# Patient Record
Sex: Female | Born: 1955 | Race: White | Hispanic: No | Marital: Married | Smoking: Never smoker
Health system: Southern US, Community
[De-identification: ages and names within clinical notes are randomized; demographics above are authoritative.]

## PROBLEM LIST (undated history)

## (undated) DIAGNOSIS — T7840XA Allergy, unspecified, initial encounter: Secondary | ICD-10-CM

## (undated) DIAGNOSIS — M810 Age-related osteoporosis without current pathological fracture: Secondary | ICD-10-CM

## (undated) DIAGNOSIS — Z8719 Personal history of other diseases of the digestive system: Secondary | ICD-10-CM

## (undated) DIAGNOSIS — M1990 Unspecified osteoarthritis, unspecified site: Secondary | ICD-10-CM

## (undated) DIAGNOSIS — Z9889 Other specified postprocedural states: Secondary | ICD-10-CM

## (undated) DIAGNOSIS — K219 Gastro-esophageal reflux disease without esophagitis: Secondary | ICD-10-CM

## (undated) DIAGNOSIS — R112 Nausea with vomiting, unspecified: Secondary | ICD-10-CM

## (undated) DIAGNOSIS — B029 Zoster without complications: Secondary | ICD-10-CM

## (undated) DIAGNOSIS — E785 Hyperlipidemia, unspecified: Secondary | ICD-10-CM

## (undated) DIAGNOSIS — R011 Cardiac murmur, unspecified: Secondary | ICD-10-CM

## (undated) DIAGNOSIS — R102 Pelvic and perineal pain: Secondary | ICD-10-CM

## (undated) DIAGNOSIS — I10 Essential (primary) hypertension: Secondary | ICD-10-CM

## (undated) DIAGNOSIS — C801 Malignant (primary) neoplasm, unspecified: Secondary | ICD-10-CM

## (undated) HISTORY — DX: Age-related osteoporosis without current pathological fracture: M81.0

## (undated) HISTORY — DX: Allergy, unspecified, initial encounter: T78.40XA

## (undated) HISTORY — DX: Essential (primary) hypertension: I10

## (undated) HISTORY — PX: FOOT SURGERY: SHX648

## (undated) HISTORY — DX: Zoster without complications: B02.9

## (undated) HISTORY — PX: COLONOSCOPY: SHX174

## (undated) HISTORY — PX: POLYPECTOMY: SHX149

## (undated) HISTORY — PX: WISDOM TOOTH EXTRACTION: SHX21

## (undated) HISTORY — PX: TONSILLECTOMY: SUR1361

## (undated) HISTORY — DX: Hyperlipidemia, unspecified: E78.5

---
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
## 1986-03-13 HISTORY — PX: VAGINAL HYSTERECTOMY: SUR661

## 2001-09-03 ENCOUNTER — Emergency Department (HOSPITAL_COMMUNITY): Admission: EM | Admit: 2001-09-03 | Discharge: 2001-09-03 | Payer: Self-pay | Admitting: Emergency Medicine

## 2005-03-16 ENCOUNTER — Emergency Department (HOSPITAL_COMMUNITY): Admission: EM | Admit: 2005-03-16 | Discharge: 2005-03-16 | Payer: Self-pay | Admitting: Emergency Medicine

## 2006-03-13 HISTORY — PX: ESOPHAGOGASTRODUODENOSCOPY: SHX1529

## 2006-03-15 ENCOUNTER — Encounter (INDEPENDENT_AMBULATORY_CARE_PROVIDER_SITE_OTHER): Payer: Self-pay | Admitting: *Deleted

## 2006-03-15 ENCOUNTER — Encounter: Admission: RE | Admit: 2006-03-15 | Discharge: 2006-03-15 | Payer: Self-pay | Admitting: Internal Medicine

## 2006-10-09 ENCOUNTER — Ambulatory Visit (HOSPITAL_COMMUNITY): Admission: RE | Admit: 2006-10-09 | Discharge: 2006-10-09 | Payer: Self-pay | Admitting: Obstetrics and Gynecology

## 2006-12-24 ENCOUNTER — Ambulatory Visit: Payer: Self-pay | Admitting: Internal Medicine

## 2007-01-21 DIAGNOSIS — K5730 Diverticulosis of large intestine without perforation or abscess without bleeding: Secondary | ICD-10-CM | POA: Insufficient documentation

## 2007-01-31 ENCOUNTER — Encounter: Payer: Self-pay | Admitting: Internal Medicine

## 2007-01-31 ENCOUNTER — Ambulatory Visit: Payer: Self-pay | Admitting: Internal Medicine

## 2007-01-31 DIAGNOSIS — D126 Benign neoplasm of colon, unspecified: Secondary | ICD-10-CM | POA: Insufficient documentation

## 2007-04-15 ENCOUNTER — Ambulatory Visit (HOSPITAL_COMMUNITY): Admission: RE | Admit: 2007-04-15 | Discharge: 2007-04-15 | Payer: Self-pay | Admitting: Obstetrics and Gynecology

## 2007-05-24 DIAGNOSIS — K2940 Chronic atrophic gastritis without bleeding: Secondary | ICD-10-CM | POA: Insufficient documentation

## 2007-05-24 DIAGNOSIS — K2100 Gastro-esophageal reflux disease with esophagitis, without bleeding: Secondary | ICD-10-CM | POA: Insufficient documentation

## 2007-05-24 DIAGNOSIS — K219 Gastro-esophageal reflux disease without esophagitis: Secondary | ICD-10-CM | POA: Insufficient documentation

## 2007-05-24 DIAGNOSIS — R1310 Dysphagia, unspecified: Secondary | ICD-10-CM | POA: Insufficient documentation

## 2007-12-16 ENCOUNTER — Encounter: Admission: RE | Admit: 2007-12-16 | Discharge: 2007-12-16 | Payer: Self-pay | Admitting: Internal Medicine

## 2009-03-30 DIAGNOSIS — K2990 Gastroduodenitis, unspecified, without bleeding: Secondary | ICD-10-CM | POA: Insufficient documentation

## 2009-03-30 DIAGNOSIS — M1990 Unspecified osteoarthritis, unspecified site: Secondary | ICD-10-CM | POA: Insufficient documentation

## 2009-03-30 DIAGNOSIS — J302 Other seasonal allergic rhinitis: Secondary | ICD-10-CM | POA: Insufficient documentation

## 2009-06-29 ENCOUNTER — Encounter: Payer: Self-pay | Admitting: Internal Medicine

## 2009-06-30 ENCOUNTER — Telehealth: Payer: Self-pay | Admitting: Internal Medicine

## 2009-08-03 ENCOUNTER — Ambulatory Visit: Payer: Self-pay | Admitting: Internal Medicine

## 2009-08-03 DIAGNOSIS — R1319 Other dysphagia: Secondary | ICD-10-CM | POA: Insufficient documentation

## 2009-08-03 DIAGNOSIS — Z860100 Personal history of colon polyps, unspecified: Secondary | ICD-10-CM | POA: Insufficient documentation

## 2010-02-10 ENCOUNTER — Ambulatory Visit (HOSPITAL_COMMUNITY): Admission: RE | Admit: 2010-02-10 | Discharge: 2010-02-10 | Payer: Self-pay | Admitting: Obstetrics and Gynecology

## 2010-04-12 NOTE — Assessment & Plan Note (Signed)
Summary: GERD followup, dysphagia    History of Present Illness Visit Type: Follow-up Visit Primary GI MD: Yancey Flemings MD Primary Provider: Rodrigo Ran, MD Chief Complaint: Dysphagia, mostly in the evening; patient also needs refill of Aciphex History of Present Illness:   55 year old white female with GERD, osteoporosis, and colon polyp. She was evaluated in October of 2008 for dysphagia and screening colonoscopy. She underwent upper endoscopy and was found to have active esophagitis. She was placed on PPI therapy and instructed to followup in 6-8 weeks. She did not. Screening colonoscopy at that same time revealed mild diverticulosis and a diminutive ascending colon polyp which was removed but not retrieved for submission (looked like an adenoma). Followup colonoscopy in 5 years recommend. She presents today for followup after contacting the office requesting medication refill. She notices that she will have significant dysphagia off PPI therapy. Rare dysphagia on PPI therapy. She is taking AcipHex 20 mg daily. She wonders about a more cost effective alternative. No appreciable medication side effects. I did discuss with her information regarding chronic PPI use and possible effect on bone density. She has her bone density monitored carefully and is on calcium and vitamin D. No other active GI complaints at this time. Her last episode of dysphagia was about 2 weeks ago when eating rice. She has not had a food impaction or near impaction. No lower GI complaints.   GI Review of Systems    Reports acid reflux and  dysphagia with solids.      Denies abdominal pain, belching, bloating, chest pain, dysphagia with liquids, heartburn, loss of appetite, nausea, vomiting, vomiting blood, weight loss, and  weight gain.      Reports diverticulosis.     Denies anal fissure, black tarry stools, change in bowel habit, constipation, diarrhea, fecal incontinence, heme positive stool, hemorrhoids, irritable bowel  syndrome, jaundice, light color stool, liver problems, rectal bleeding, and  rectal pain. Preventive Screening-Counseling & Management  Alcohol-Tobacco     Smoking Status: never  Caffeine-Diet-Exercise     Does Patient Exercise: yes      Drug Use:  no.      Current Medications (verified): 1)  Aciphex 20 Mg Tbec (Rabeprazole Sodium) .Marland Kitchen.. 1 Tablet By Mouth Once Daily 2)  Estradiol 0.5 Mg Tabs (Estradiol) .... Take 2 Tablets By Mouth Daily 3)  Calcium 1500 Mg Tabs (Calcium Carbonate) .... Once Daily 4)  Vitamin D 1500iu .... Once Daily  Allergies (verified): 1)  ! Codeine  Past History:  Past Medical History: Reviewed history from 05/24/2007 and no changes required. Current Problems:  DYSPHAGIA UNSPECIFIED (ICD-787.20) GASTROESOPHAGEAL REFLUX DISEASE (ICD-530.81) GASTRITIS, CHRONIC (ICD-535.10) ESOPHAGITIS, REFLUX (ICD-530.11) POLYP, COLON (ICD-211.3) DIVERTICULOSIS, COLON (ICD-562.10)  Past Surgical History: Reviewed history from 05/24/2007 and no changes required. Total Abdominal Hysterectomy  Family History: Reviewed history and no changes required. No FH of Colon Cancer: Brain Cancer: Father Family History of Colon Polyps:Mother Family History of Diabetes: GF RA & AS: Son Diverticulitis-mother  Social History: Reviewed history and no changes required. Occupation: Psychologist, sport and exercise Patient has never smoked.  Alcohol Use - yes Daily Caffeine Use Illicit Drug Use - no Patient gets regular exercise. Smoking Status:  never Drug Use:  no Does Patient Exercise:  yes  Review of Systems       The patient complains of allergy/sinus, anemia, arthritis/joint pain, back pain, and muscle pains/cramps.  The patient denies anxiety-new, blood in urine, breast changes/lumps, change in vision, confusion, cough, coughing up blood, depression-new, fainting, fatigue, fever,  headaches-new, hearing problems, heart murmur, heart rhythm changes, itching, menstrual pain, night sweats,  nosebleeds, pregnancy symptoms, shortness of breath, skin rash, sleeping problems, sore throat, swelling of feet/legs, swollen lymph glands, thirst - excessive , urination - excessive , urination changes/pain, urine leakage, vision changes, and voice change.    Vital Signs:  Patient profile:   55 year old female Height:      63 inches Weight:      139.25 pounds BMI:     24.76 Pulse rate:   72 / minute Pulse rhythm:   regular BP sitting:   110 / 78  (left arm) Cuff size:   regular  Vitals Entered By: June McMurray CMA Duncan Dull) (Aug 03, 2009 11:06 AM)  Physical Exam  General:  Well developed, well nourished, no acute distress. Head:  Normocephalic and atraumatic. Eyes:  PERRLA, no icterus. Nose:  No deformity, discharge,  or lesions. Mouth:  No deformity or lesions, dentition normal. Neck:  Supple; no masses or thyromegaly. Lungs:  Clear throughout to auscultation. Heart:  Regular rate and rhythm; no murmurs, rubs,  or bruits. Abdomen:  Soft, nontender and nondistended. No masses, hepatosplenomegaly or hernias noted. Normal bowel sounds. Pulses:  Normal pulses noted. Extremities:  no edema Neurologic:  Alert and  oriented x4;   Skin:  Intact without significant lesions or rashes. Psych:  Alert and cooperative. Normal mood and affect.   Impression & Recommendations:  Problem # 1:  GASTROESOPHAGEAL REFLUX DISEASE (ICD-530.81) GERD with endoscopic evidence of esophagitis on prior upper endoscopy.  Plan: #1. Reflux precautions #2. Continue PPI therapy to address esophagitis and associated dysphagia #3. Prescribed generic omeprazole milligrams daily. Multiple refills #4. Routine GI followup in 2 years. Sooner for problems or questions  Problem # 2:  PERSONAL HX COLONIC POLYPS (ICD-V12.72) history of diminutive colon polyp, likely adenoma. Surveillance colonoscopy plan around November 2013. Keep that anniversary  Problem # 3:  DYSPHAGIA (ICD-787.29) the do to esophagitis,  possible underlying stricture. Minimal symptoms as long as she takes PPI therapy regularly. We discussed the prospect of esophageal dilation should her dysphagia worsen despite PPI therapy. She is familiar with this, as her mother has needed the same  Patient Instructions: 1)  Change Aciphex to Omeprazole 20 mg by mouth once daily 2)  #30 x 11 RFs. sent to pharmacy. 3)  Office Visit in 2 years. 4)  The medication list was reviewed and reconciled.  All changed / newly prescribed medications were explained.  A complete medication list was provided to the patient / caregiver. 5)  prited and given to patient. Milford Cage NCMA  Aug 03, 2009 11:36 AM 6)  copy: Dr. Rodrigo Ran Prescriptions: OMEPRAZOLE 20 MG CPDR (OMEPRAZOLE) 1 by mouth once daily  #30 x 11   Entered by:   Milford Cage NCMA   Authorized by:   Hilarie Fredrickson MD   Signed by:   Milford Cage NCMA on 08/03/2009   Method used:   Electronically to        [facility]* (retail)       [address]       [place], [place]  [id number]       Ph: [phone]       Fax: [phone]   RxID:   [id number]

## 2010-04-12 NOTE — Medication Information (Signed)
Summary: [facility]   Imported By: Lester Kirtland 07/05/2009 10:12:36  _____________________________________________________________________  External Attachment:    Type:   Image     Comment:   External Document

## 2010-04-12 NOTE — Progress Notes (Signed)
Summary: Aciphex refill/appt. made   Phone Note Outgoing Call   Call placed by: Milford Cage NCMA,  June 30, 2009 11:18 AM Call placed to: Patient Summary of Call: Called patient and advised she needs to make an appt. with Dr. Marina Goodell since it has been since 12/2006 since last seen.  Appt. made for 08/03/09 and #30 of Aciphex given until seen. Milford Cage NCMA  June 30, 2009 11:19 AM  Initial call taken by: Milford Cage NCMA,  June 30, 2009 11:19 AM

## 2010-07-26 NOTE — Assessment & Plan Note (Signed)
Jody Lucas                         GASTROENTEROLOGY OFFICE NOTE   Jody Lucas, Jody Lucas                    MRN:          [id number]  DATE:12/24/2006                            DOB:          20-Nov-1955    REFERRING PHYSICIAN:  Loraine Leriche A. Perini, M.D.   REASON FOR CONSULTATION:  Dysphagia and screening colonoscopy.   HISTORY:  This is a pleasant 55 year old white female with no  significant past medical history who is referred through the courtesy of  Dr. Waynard Edwards regarding dysphagia and screening colonoscopy.  The patient  reports a 52-month history of intermittent solid food dysphagia occurring  approximately 2 to 3 times per month.  The frequency and severity has  not worsened.  There has been no associated heartburn or weight loss.  Next, she reports not having had screening colonoscopy.  She reports  that her mother has a history of colon polyps.  She denies abdominal  pain, change in bowel habits, or rectal bleeding.  No weight loss.   PAST MEDICAL HISTORY:  None.   PAST SURGICAL HISTORY:  Hysterectomy (ovaries are intact).   ALLERGIES:  CODEINE.   CURRENT MEDICATIONS:  Calcium.  Multivitamin.  Iron supplement.   FAMILY HISTORY:  Mother with colon polyps and ulcerative colitis.   SOCIAL HISTORY:  The patient is married with 3 children.  She lives with  her husband.  She attends college.  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  She does not smoke or use alcohol.   REVIEW OF SYSTEMS:  Per diagnostic evaluation form.   PHYSICAL EXAM:  Well-appearing female in no acute distress.  Blood pressure is 120/76, heart rate is 68, weight 140 pounds.  She is 5  feet 3 inches in height.  HEENT:  Sclerae anicteric.  Conjunctivae pink.  Oral mucosa is intact.  LUNGS:  Clear.  HEART:  Regular.  ABDOMEN:  Soft without tenderness, mass, or hernia.  EXTREMITIES:  Without edema.   IMPRESSION:  1. Intermittent solid food dysphagia  without other worrisome features.      Rule out ring, stricture, or web.  2. Screening colonoscopy.  Mother with a history of colon polyps.  She      is an appropriate candidate for screening without contraindication.   RECOMMENDATIONS:  Upper endoscopy with possible esophageal dilation and  colonoscopy with polypectomy if needed.  The nature of the procedures as  well as risks, benefits, and alternatives were discussed in detail.  She  understood and agreed to proceed.  As well, literature provided on  colonoscopy, colon cancer screening, upper endoscopy, esophageal  dilation, and esophageal stricture.     Wilhemina Bonito. Marina Goodell, MD  Electronically Signed    JNP/MedQ  DD: 12/24/2006  DT: 12/25/2006  Job #: [id number]   cc:   Loraine Leriche A. Waynard Edwards, M.D.  Katherine Roan, M.D.

## 2011-03-21 DIAGNOSIS — H43819 Vitreous degeneration, unspecified eye: Secondary | ICD-10-CM | POA: Insufficient documentation

## 2011-03-21 DIAGNOSIS — H04123 Dry eye syndrome of bilateral lacrimal glands: Secondary | ICD-10-CM | POA: Insufficient documentation

## 2011-04-11 ENCOUNTER — Other Ambulatory Visit: Payer: Self-pay

## 2011-04-11 MED ORDER — OMEPRAZOLE 20 MG PO CPDR: 20.0000 mg | DELAYED_RELEASE_CAPSULE | Freq: Two times a day (BID) | ORAL | Status: DC

## 2011-04-11 NOTE — Telephone Encounter (Signed)
rx refill for omeprazole; filled rx for patient

## 2011-04-13 ENCOUNTER — Other Ambulatory Visit: Payer: Self-pay | Admitting: Internal Medicine

## 2011-07-25 DIAGNOSIS — I10 Essential (primary) hypertension: Secondary | ICD-10-CM | POA: Insufficient documentation

## 2012-01-19 ENCOUNTER — Encounter: Payer: Self-pay | Admitting: Internal Medicine

## 2012-03-29 ENCOUNTER — Encounter: Payer: Self-pay | Admitting: Internal Medicine

## 2012-04-24 ENCOUNTER — Other Ambulatory Visit: Payer: Self-pay | Admitting: Dermatology

## 2012-04-25 ENCOUNTER — Ambulatory Visit: Payer: Self-pay | Admitting: Gynecology

## 2012-05-01 ENCOUNTER — Ambulatory Visit (AMBULATORY_SURGERY_CENTER): Payer: 59 | Admitting: *Deleted

## 2012-05-01 ENCOUNTER — Encounter: Payer: Self-pay | Admitting: Internal Medicine

## 2012-05-01 VITALS — Ht 63.0 in | Wt 147.6 lb

## 2012-05-01 DIAGNOSIS — Z86010 Personal history of colonic polyps: Secondary | ICD-10-CM

## 2012-05-01 DIAGNOSIS — Z1211 Encounter for screening for malignant neoplasm of colon: Secondary | ICD-10-CM

## 2012-05-01 MED ORDER — MOVIPREP 100 G PO SOLR: 1.0000 | Freq: Once | ORAL | Status: DC

## 2012-05-01 NOTE — Progress Notes (Signed)
No egg or soy allergy. ewm No problems with sedation. ewm

## 2012-05-06 ENCOUNTER — Other Ambulatory Visit (HOSPITAL_COMMUNITY)
Admission: RE | Admit: 2012-05-06 | Discharge: 2012-05-06 | Disposition: A | Discharge status: Home / Self Care | Payer: 59 | Source: Ambulatory Visit | Attending: Gynecology | Admitting: Gynecology

## 2012-05-06 ENCOUNTER — Encounter: Payer: Self-pay | Admitting: Gynecology

## 2012-05-06 ENCOUNTER — Ambulatory Visit (INDEPENDENT_AMBULATORY_CARE_PROVIDER_SITE_OTHER): Payer: 59 | Admitting: Gynecology

## 2012-05-06 VITALS — BP 120/78 | Ht 62.5 in | Wt 142.0 lb

## 2012-05-06 DIAGNOSIS — Z01419 Encounter for gynecological examination (general) (routine) without abnormal findings: Secondary | ICD-10-CM

## 2012-05-06 DIAGNOSIS — N949 Unspecified condition associated with female genital organs and menstrual cycle: Secondary | ICD-10-CM

## 2012-05-06 DIAGNOSIS — R102 Pelvic and perineal pain: Secondary | ICD-10-CM

## 2012-05-06 MED ORDER — ESTRADIOL 1 MG PO TABS: 0.5000 mg | ORAL_TABLET | Freq: Every day | ORAL | Status: DC

## 2012-05-06 NOTE — Progress Notes (Signed)
Jody Lucas 01/10/1956 [id number]        57 y.o.  G3P3003 new patient for annual exam.  Former patient of Dr. Angeline Slim. Several issues noted below.  Past medical history,surgical history, medications, allergies, family history and social history were all reviewed and documented in the EPIC chart. ROS:  Was performed and pertinent positives and negatives are included in the history.  Exam: [name] assistant Filed Vitals:   05/06/12 1516  BP: 120/78  Height: 5' 2.5" (1.588 m)  Weight: 142 lb (64.411 kg)   General appearance  Normal Skin grossly normal Head/Neck normal with no cervical or supraclavicular adenopathy thyroid normal Lungs  clear Cardiac RR, without RMG Abdominal  soft, nontender, without masses, organomegaly or hernia Breasts  examined lying and sitting without masses, retractions, discharge or axillary adenopathy. Pelvic  Ext/BUS/vagina  normal    Adnexa  Without masses or tenderness    Anus and perineum  normal   Rectovaginal  normal sphincter tone without palpated masses or tenderness.    Assessment/Plan:  57 y.o. G47P3003 female for annual exam.   1. History of TVH for bleeding and dysplasia. 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 Pap smears have been normal since then. Last Pap smear 2011. Pap done today of cough. Discussed current screening guidelines and options to stop screening altogether versus less frequent screening interval reviewed and we'll readdress on an annual basis. 2. Vaginal pain. Patient notes some fleeting vaginal-type pain aching to stabbing lasts minutes. Sometimes wakes her from the sleep. No significant constipation diarrhea or urinary symptoms. No dyspareunia. Exam is normal. We'll start with ultrasound. She does have colonoscopy scheduled this week. 3. ERT.  Patients on Estrace 1 mg started a number of years ago due to hot flashes and night sweats. Recommended decreasing to 0.5 mg and see how she does this year. Ultimately planning on  weaning her off next year.  Reviewed the WHI study and risks of ERT to include stroke heart attack DVT possible breast cancer issues. Patient's comfortable continuing but does agree with the ultimate plan of weaning her next year. 4. Osteoporosis. Recent DEXA 05/2011. Actively being followed by Dr. Waynard Edwards. On no current medications. She'll continue to follow up with him for management of her osteoporosis. 5. Mammogram 03/2012. Continued annual mammography. SBE monthly reviewed. 6. Colonoscopy scheduled this week. Will follow up for this. 7. Health maintenance.  No blood work done as it is all done through Dr. Laurey Morale office. Follow up for ultrasound, otherwise annually.    Dara Lords MD, 4:39 PM 05/06/2012

## 2012-05-06 NOTE — Patient Instructions (Addendum)
Follow up for ultrasound as scheduled 

## 2012-05-07 LAB — URINALYSIS W MICROSCOPIC + REFLEX CULTURE
Bacteria, UA: NONE SEEN
Crystals: NONE SEEN
Ketones, ur: NEGATIVE mg/dL
Nitrite: NEGATIVE
Specific Gravity, Urine: 1.006 (ref 1.005–1.030)
Urobilinogen, UA: 0.2 mg/dL (ref 0.0–1.0)

## 2012-05-09 ENCOUNTER — Encounter: Payer: Self-pay | Admitting: Internal Medicine

## 2012-05-09 ENCOUNTER — Ambulatory Visit (AMBULATORY_SURGERY_CENTER): Payer: 59 | Admitting: Internal Medicine

## 2012-05-09 VITALS — BP 141/91 | HR 67 | Temp 96.3°F | Resp 25 | Ht 63.0 in | Wt 147.0 lb

## 2012-05-09 DIAGNOSIS — Z860100 Personal history of colon polyps, unspecified: Secondary | ICD-10-CM

## 2012-05-09 DIAGNOSIS — K5730 Diverticulosis of large intestine without perforation or abscess without bleeding: Secondary | ICD-10-CM

## 2012-05-09 DIAGNOSIS — Z1211 Encounter for screening for malignant neoplasm of colon: Secondary | ICD-10-CM

## 2012-05-09 MED ORDER — SODIUM CHLORIDE 0.9 % IV SOLN: 500.0000 mL | INTRAVENOUS | Status: DC

## 2012-05-09 NOTE — Progress Notes (Signed)
Patient did not experience any of the following events: a burn prior to discharge; a fall within the facility; wrong site/side/patient/procedure/implant event; or a hospital transfer or hospital admission upon discharge from the facility. (G8907) Patient did not have preoperative order for IV antibiotic SSI prophylaxis. (G8918)  

## 2012-05-09 NOTE — Progress Notes (Signed)
Lidocaine-40mg IV prior to Propofol InductionPropofol given over incremental dosages 

## 2012-05-09 NOTE — Op Note (Signed)
[facility] [address] [place], [address]   COLONOSCOPY PROCEDURE REPORT  PATIENT: Jody Lucas, Jody Lucas  MR#: [id number] BIRTHDATE: Mar 24, 1955 , 56  yrs. old GENDER: Female ENDOSCOPIST: Roxy Cedar, MD REFERRED AO:[id number] Program Recall PROCEDURE DATE:  05/09/2012 PROCEDURE:   Colonoscopy, surveillance ASA CLASS:   Class II INDICATIONS:Patient's personal history of colon polyps. November 2008 with diminutive polyp (looked like adenoma). MEDICATIONS: MAC sedation, administered by CRNA and propofol (Diprivan) 250mg  IV  DESCRIPTION OF PROCEDURE:   After the risks benefits and alternatives of the procedure were thoroughly explained, informed consent was obtained.  A digital rectal exam revealed no abnormalities of the rectum.   The LB [id number]-H180AL [id number]  endoscope was introduced through the anus and advanced to the cecum, which was identified by both the appendix and ileocecal valve. No adverse events experienced.   The quality of the prep was good, using MoviPrep  The instrument was then slowly withdrawn as the colon was fully examined.      COLON FINDINGS: Moderate diverticulosis was noted in the sigmoid colon.   The colon mucosa was otherwise normal.  Retroflexed views revealed internal hemorrhoids. The time to cecum=3 minutes 42 seconds.  Withdrawal time=10 minutes 17 seconds.  The scope was withdrawn and the procedure completed. COMPLICATIONS: There were no complications.  ENDOSCOPIC IMPRESSION: 1.   Moderate diverticulosis was noted in the sigmoid colon 2.   The colon mucosa was otherwise normal  RECOMMENDATIONS: 1. Continue current colorectal screening recommendations for "routine risk" patients with a repeat colonoscopy in 10 years.   eSigned:  Roxy Cedar, MD 05/09/2012 11:03 AM   cc: The Patient and Rodrigo Ran, MD

## 2012-05-09 NOTE — Patient Instructions (Addendum)
Discharge instructions given with verbal understanding. Handout on diverticulosis and a high fiber diet given. Resume previous medications. YOU HAD AN ENDOSCOPIC PROCEDURE TODAY AT THE [facility]: Refer to the procedure report that was given to you for any specific questions about what was found during the examination.  If the procedure report does not answer your questions, please call your gastroenterologist to clarify.  If you requested that your care partner not be given the details of your procedure findings, then the procedure report has been included in a sealed envelope for you to review at your convenience later.  YOU SHOULD EXPECT: Some feelings of bloating in the abdomen. Passage of more gas than usual.  Walking can help get rid of the air that was put into your GI tract during the procedure and reduce the bloating. If you had a lower endoscopy (such as a colonoscopy or flexible sigmoidoscopy) you may notice spotting of blood in your stool or on the toilet paper. If you underwent a bowel prep for your procedure, then you may not have a normal bowel movement for a few days.  DIET: Your first meal following the procedure should be a light meal and then it is ok to progress to your normal diet.  A half-sandwich or bowl of soup is an example of a good first meal.  Heavy or fried foods are harder to digest and may make you feel nauseous or bloated.  Likewise meals heavy in dairy and vegetables can cause extra gas to form and this can also increase the bloating.  Drink plenty of fluids but you should avoid alcoholic beverages for 24 hours.  ACTIVITY: Your care partner should take you home directly after the procedure.  You should plan to take it easy, moving slowly for the rest of the day.  You can resume normal activity the day after the procedure however you should NOT DRIVE or use heavy machinery for 24 hours (because of the sedation medicines used during the test).    SYMPTOMS TO  REPORT IMMEDIATELY: A gastroenterologist can be reached at any hour.  During normal business hours, 8:30 AM to 5:00 PM Monday through Friday, call [phone].  After hours and on weekends, please call the GI answering service at [phone] who will take a message and have the physician on call contact you.   Following lower endoscopy (colonoscopy or flexible sigmoidoscopy):  Excessive amounts of blood in the stool  Significant tenderness or worsening of abdominal pains  Swelling of the abdomen that is new, acute  Fever of 100F or higher  FOLLOW UP: If any biopsies were taken you will be contacted by phone or by letter within the next 1-3 weeks.  Call your gastroenterologist if you have not heard about the biopsies in 3 weeks.  Our staff will call the home number listed on your records the next business day following your procedure to check on you and address any questions or concerns that you may have at that time regarding the information given to you following your procedure. This is a courtesy call and so if there is no answer at the home number and we have not heard from you through the emergency physician on call, we will assume that you have returned to your regular daily activities without incident.  SIGNATURES/CONFIDENTIALITY: You and/or your care partner have signed paperwork which will be entered into your electronic medical record.  These signatures attest to the fact that that the information above on   your After Visit Summary has been reviewed and is understood.  Full responsibility of the confidentiality of this discharge information lies with you and/or your care-partner. 

## 2012-05-10 ENCOUNTER — Telehealth: Payer: Self-pay | Admitting: *Deleted

## 2012-05-10 NOTE — Telephone Encounter (Signed)
  Follow up Call-  Call back number 05/09/2012  Post procedure Call Back phone  # [phone]  Permission to leave phone message Yes     Patient questions:  Do you have a fever, pain , or abdominal swelling? no Pain Score  0 *  Have you tolerated food without any problems? yes  Have you been able to return to your normal activities? yes  Do you have any questions about your discharge instructions: Diet   no Medications  no Follow up visit  no  Do you have questions or concerns about your Care? no  Actions: * If pain score is 4 or above: No action needed, pain <4.

## 2012-05-15 ENCOUNTER — Other Ambulatory Visit: Payer: Self-pay | Admitting: Gynecology

## 2012-05-15 ENCOUNTER — Encounter: Payer: Self-pay | Admitting: Gynecology

## 2012-05-15 ENCOUNTER — Ambulatory Visit (INDEPENDENT_AMBULATORY_CARE_PROVIDER_SITE_OTHER): Payer: 59

## 2012-05-15 ENCOUNTER — Ambulatory Visit (INDEPENDENT_AMBULATORY_CARE_PROVIDER_SITE_OTHER): Payer: 59 | Admitting: Gynecology

## 2012-05-15 DIAGNOSIS — R102 Pelvic and perineal pain: Secondary | ICD-10-CM

## 2012-05-15 DIAGNOSIS — N949 Unspecified condition associated with female genital organs and menstrual cycle: Secondary | ICD-10-CM

## 2012-05-15 DIAGNOSIS — N83209 Unspecified ovarian cyst, unspecified side: Secondary | ICD-10-CM

## 2012-05-15 DIAGNOSIS — D3912 Neoplasm of uncertain behavior of left ovary: Secondary | ICD-10-CM

## 2012-05-15 DIAGNOSIS — D3910 Neoplasm of uncertain behavior of unspecified ovary: Secondary | ICD-10-CM

## 2012-05-15 NOTE — Patient Instructions (Signed)
Followup for ultrasound in 6 months to follow the left ovarian cyst.

## 2012-05-15 NOTE — Progress Notes (Signed)
Patient goals are for ultrasound due to episodic vaginal pain. She had a colonoscopy and in fact does have diverticulosis and her gastroenterologist noted this may be contributing to her pain.  Ultrasound status post hysterectomy shows normal-appearing right ovary. 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 Small area of calcification 0.3 mm noted at periphery.  Assessment and plan: Vaginal pain small left ovarian cyst. On review of correct words to include pictures she has a unchanged cyst dating back to 2008/2009 which in review looks identical to what at present today. Possibilities include endometrioma reviewed. Cannot rule out cancer also discussed although highly unlikely given the stability over years time. Options for surgery, reultrasound in 6 months with or without baseline CA 125 reviewed and offered. Possibilities of false positive CA 125  particularly if endometrioma discussed. After lengthy discussion the patient would prefer no intervention, no CA 125 but repeat ultrasound in 6 months and she will followup for this.

## 2012-12-12 ENCOUNTER — Ambulatory Visit (INDEPENDENT_AMBULATORY_CARE_PROVIDER_SITE_OTHER): Payer: 59

## 2012-12-12 ENCOUNTER — Ambulatory Visit (INDEPENDENT_AMBULATORY_CARE_PROVIDER_SITE_OTHER): Payer: 59 | Admitting: Podiatry

## 2012-12-12 VITALS — BP 132/90 | HR 62 | Resp 12 | Ht 63.0 in | Wt 138.0 lb

## 2012-12-12 DIAGNOSIS — M21611 Bunion of right foot: Secondary | ICD-10-CM

## 2012-12-12 DIAGNOSIS — M21619 Bunion of unspecified foot: Secondary | ICD-10-CM

## 2012-12-12 DIAGNOSIS — M2040 Other hammer toe(s) (acquired), unspecified foot: Secondary | ICD-10-CM

## 2012-12-12 DIAGNOSIS — M2010 Hallux valgus (acquired), unspecified foot: Secondary | ICD-10-CM

## 2012-12-12 NOTE — Patient Instructions (Signed)
Call with questions.

## 2012-12-12 NOTE — Progress Notes (Signed)
N  SORE, BURNING SENSATION, SWELLING L   B/L FOOT AND TOES D   20 YEARS O   SLOWLY C    WORSE A     WALKING, SITTING T      SOAKING ([place]) WORSE OVER THE LAST 15 YEARS. GETTING KNOTS ON OTHER TOES. TAILOR BUNION BOTH FEET.. 2ND TOE PLANTAR  CALLUS. The pain has been gradually getting worse over this period of time and making shoe gear increasingly difficult. Patient would like to discuss surgical correction of deformity. Objective findings; pulses PT +2/4 bilateral DP +2/4 bilateral. Neurological was found to be intact sharp dull and vibratory intact.. Muscle strength intact all muscle groups. Structural deformity indicates large hyperostosis medial aspect first metatarsal head bilateral with deformity of the fifth metatarsal bilateral. Keratotic lesion noted second digit left foot with pain.  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 Also noted to have symptomatic tailor's bunion deformity bilateral and keratotic lesion second digit left foot that is symptomatic   Plan; discussed with patient treatment options. She has done all conservative care consisting of wider shoe gear soft leather-type shoes soaks and oral anti-inflammatories. reviewed x-rays and recommended surgical intervention with a 1 foot been corrected at the time. Patient will have aggressive Austin bunionectomy metatarsal osteotomy fifth left foot and hammertoe repair second left. I explained we will not be able to get complete correction of deformity but it should give her inferior good long-term result. Recently had a one week for consult surgery scheduled for 2 weeks.

## 2012-12-19 ENCOUNTER — Encounter: Payer: Self-pay | Admitting: Podiatry

## 2012-12-19 ENCOUNTER — Ambulatory Visit (INDEPENDENT_AMBULATORY_CARE_PROVIDER_SITE_OTHER): Payer: 59 | Admitting: Podiatry

## 2012-12-19 VITALS — BP 158/98 | HR 63 | Resp 16

## 2012-12-19 DIAGNOSIS — M2010 Hallux valgus (acquired), unspecified foot: Secondary | ICD-10-CM

## 2012-12-19 DIAGNOSIS — M2040 Other hammer toe(s) (acquired), unspecified foot: Secondary | ICD-10-CM

## 2012-12-19 MED ORDER — PROMETHAZINE HCL 25 MG PO TABS: 25.0000 mg | ORAL_TABLET | ORAL | Status: DC | PRN

## 2012-12-19 MED ORDER — OXYCODONE-ACETAMINOPHEN 10-325 MG PO TABS: 1.0000 | ORAL_TABLET | Freq: Three times a day (TID) | ORAL | Status: DC | PRN

## 2012-12-19 MED ORDER — MEPERIDINE HCL 50 MG PO TABS: 50.0000 mg | ORAL_TABLET | ORAL | Status: DC | PRN

## 2012-12-19 NOTE — Patient Instructions (Signed)
Pre-Operative Instructions  Congratulations, you have decided to take an important step to improving your quality of life.  You can be assured that the doctors of [facility] will be with you every step of the way.  1. Plan to be at the surgery center/hospital at least 1 (one) hour prior to your scheduled time unless otherwise directed by the surgical center/hospital staff.  You must have a responsible adult accompany you, remain during the surgery and drive you home.  Make sure you have directions to the surgical center/hospital and know how to get there on time. 2. For hospital based surgery you will need to obtain a history and physical form from your family physician within 1 month prior to the date of surgery- we will give you a form for you primary physician.  3. We make every effort to accommodate the date you request for surgery.  There are however, times where surgery dates or times have to be moved.  We will contact you as soon as possible if a change in schedule is required.   4. No Aspirin/Ibuprofen for one week before surgery.  If you are on aspirin, any non-steroidal anti-inflammatory medications (Mobic, Aleve, Ibuprofen) you should stop taking it 7 days prior to your surgery.  You make take Tylenol  For pain prior to surgery.  5. Medications- If you are taking daily heart and blood pressure medications, seizure, reflux, allergy, asthma, anxiety, pain or diabetes medications, make sure the surgery center/hospital is aware before the day of surgery so they may notify you which medications to take or avoid the day of surgery. 6. No food or drink after midnight the night before surgery unless directed otherwise by surgical center/hospital staff. 7. No alcoholic beverages 24 hours prior to surgery.  No smoking 24 hours prior to or 24 hours after surgery. 8. Wear loose pants or shorts- loose enough to fit over bandages, boots, and casts. 9. No slip on shoes, sneakers are best. 10. Bring  your boot with you to the surgery center/hospital.  Also bring crutches or a walker if your physician has prescribed it for you.  If you do not have this equipment, it will be provided for you after surgery. 11. If you have not been contracted by the surgery center/hospital by the day before your surgery, call to confirm the date and time of your surgery. 12. Leave-time from work may vary depending on the type of surgery you have.  Appropriate arrangements should be made prior to surgery with your employer. 13. Prescriptions will be provided immediately following surgery by your doctor.  Have these filled as soon as possible after surgery and take the medication as directed. 14. Remove nail polish on the operative foot. 15. Wash the night before surgery.  The night before surgery wash the foot and leg well with the antibacterial soap provided and water paying special attention to beneath the toenails and in between the toes.  Rinse thoroughly with water and dry well with a towel.  Perform this wash unless told not to do so by your physician.  Enclosed: 1 Ice pack (please put in freezer the night before surgery)   1 Hibiclens skin cleaner   Pre-op Instructions  If you have any questions regarding the instructions, do not hesitate to call our office.  [place]: [address] [place], [place] [address] [phone]  [place]: [address] [place], [place] [address] [phone]  [place]: [address]  [place], [place] [address] [phone]  Dr. Richard   Tuchman DPM, Dr. Norman Regal DPM Dr. Richard Sikora DPM, Dr. M. Todd Hyatt DPM, Dr. Kathryn Egerton DPM 

## 2012-12-19 NOTE — Progress Notes (Signed)
Subjective:     Patient ID: Jody Lucas, female   DOB: 1955-11-26, 57 y.o.   MRN: [id number]  Foot Pain   patient presents for consult concerning correction of her feet. States that she needs to wakeful January to do her surgery since she  must go out of town prior. Would like to do consent form today since she has to drive from [place]   Review of Systems  All other systems reviewed and are negative.       Objective:   Physical Exam  Nursing note and vitals reviewed. Constitutional: She is oriented to person, place, and time.  Cardiovascular: Intact distal pulses.   Musculoskeletal: Normal range of motion.  Neurological: She is oriented to person, place, and time.  Skin: Skin is warm.   Patient has a large structural deformities of the first metatarsal bilateral. They're noted to be red and painful. Patient has a keratotic lesion sub-second toe proximal phalanx left that is painful. 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 Hammertoe deformity second left    Plan:     H&P performed. Reviewed structural correction of feet and allow her to read consent form for correction. Left foot will have Austin bunionectomy with pin fixation tailors bunion with screw fixation and arthroplasty second toe left. We reviewed all possible complications as listed and the fact that we'll take 6 months to one year for complete healing. Status fair fracture walker with all instructions on usage. Encouraged to call if she should have any questions prior to surgery scheduled for first week of January.

## 2013-01-20 ENCOUNTER — Encounter: Payer: 59 | Admitting: Podiatry

## 2013-03-17 ENCOUNTER — Telehealth: Payer: Self-pay | Admitting: *Deleted

## 2013-03-17 NOTE — Telephone Encounter (Signed)
Pt presents to office for signature on [employer] long-term insurance forms to cover the fees for a CMA to assist after 03/18/2013 surgery of left foot.  See scanned copies.

## 2013-03-18 ENCOUNTER — Encounter: Payer: Self-pay | Admitting: Podiatry

## 2013-03-18 DIAGNOSIS — M2040 Other hammer toe(s) (acquired), unspecified foot: Secondary | ICD-10-CM

## 2013-03-18 DIAGNOSIS — M2010 Hallux valgus (acquired), unspecified foot: Secondary | ICD-10-CM

## 2013-03-18 DIAGNOSIS — M21619 Bunion of unspecified foot: Secondary | ICD-10-CM

## 2013-03-21 ENCOUNTER — Telehealth: Payer: Self-pay | Admitting: *Deleted

## 2013-03-21 NOTE — Telephone Encounter (Signed)
Pt has questions about her pain medications.  I left a message to call our office.

## 2013-03-24 ENCOUNTER — Encounter: Payer: 59 | Admitting: Podiatry

## 2013-03-24 ENCOUNTER — Telehealth: Payer: Self-pay | Admitting: *Deleted

## 2013-03-24 MED ORDER — MEPERIDINE HCL 50 MG PO TABS: 50.0000 mg | ORAL_TABLET | ORAL | Status: DC | PRN

## 2013-03-24 NOTE — Telephone Encounter (Signed)
Pt states Jody Lucas from [employer] would pick up her Demerol.

## 2013-03-24 NOTE — Telephone Encounter (Signed)
Demerol 50mg  #15 one tablet every 4 to 6 hours prn, okayed by Dr Paulla Dolly.

## 2013-03-24 NOTE — Telephone Encounter (Signed)
Patient stated I been taking the Demerol that Dr. Paulla Dolly prescribed.  He had given me a prescription prior to surgery for the Demerol.  Then he gave me a prescription the day of surgery.  I didn't realize what it was and threw it away.  So now I don't have any pain medicine.  So can he call me in a refill because I live in [place].  I told her the Demerol can't be called in.  She said she can have someone come by to pick it up.  I told her I would have to contact on-call doctor because we don't have a doctor in the office.  She asked for Dr. Mellody Drown phone number.  I told her I was not at liberty to do that, reiterated to her again that I would call her back when I spoke to on-call doctor with a response.

## 2013-03-25 NOTE — Progress Notes (Signed)
1) Austin bunionectomy left foot  2) metatarsal osteotomy 5th left foot 3) Hammer toe repair 2nd toe left foot

## 2013-03-26 ENCOUNTER — Encounter: Payer: 59 | Admitting: Podiatry

## 2013-03-26 ENCOUNTER — Encounter: Payer: Self-pay | Admitting: Podiatry

## 2013-03-26 ENCOUNTER — Ambulatory Visit (INDEPENDENT_AMBULATORY_CARE_PROVIDER_SITE_OTHER): Payer: 59

## 2013-03-26 ENCOUNTER — Ambulatory Visit (INDEPENDENT_AMBULATORY_CARE_PROVIDER_SITE_OTHER): Payer: 59 | Admitting: Podiatry

## 2013-03-26 VITALS — BP 142/90 | HR 72 | Temp 97.6°F | Resp 12

## 2013-03-26 DIAGNOSIS — R52 Pain, unspecified: Secondary | ICD-10-CM

## 2013-03-26 DIAGNOSIS — M2010 Hallux valgus (acquired), unspecified foot: Secondary | ICD-10-CM

## 2013-03-26 MED ORDER — MEPERIDINE HCL 50 MG PO TABS: 50.0000 mg | ORAL_TABLET | ORAL | Status: DC | PRN

## 2013-03-26 NOTE — Patient Instructions (Signed)
In a seated position remove boot in bandage and take shower. Replaced gauze Ace wrap and boot as instructed. Remove boot 3 times a day and to move foot up and down for 2-3 minutes and a seated position.

## 2013-03-28 NOTE — Progress Notes (Signed)
Patient ID: Jody Lucas, female   DOB: 02-02-1956, 58 y.o.   MRN: [id number]  Subjective: Patient presents postop Liane Comber bunionectomy, fifth metatarsal osteotomy, hammertoe repair left foot on 03/18/2013. Procedures performed by DR. Regal. Patient having minimal discomfort at this time.  Objective: All surgical sites are approximated with minimal edema and no erythema or drainage noted from the surgical sites. The left calf is not edematous or tender. All surgical sites are in satisfactory alignment.    X-ray report left foot  Evidence of first metatarsal osteotomy in satisfactory alignment with a single internal single K wire noted. An oteotomy noted in the surgical neck of the fifth metatarsal with a single screw fixation with satisfactory alignment.Marland Kitchen Resection the proximal phalanx second digit noted.  Radiographic impression: Satisfactory alignment of Austin bunionectomy  Satisfactory alignment of fifth metatsal osteotomy Hammertoe repair second digit with satisfactory alignment  Assessment: Satisfactory progress radiographically and clinically No evidence of infection or DVT  Plan: Reapply a light sterile compression dressing. Continue to ambulate with Cam Walker boot. Patient will be allowed to get the wound site shower wet in the seated position only. She was instructed to remove boot in a seated position and dorsi and plantar flex the left foot several times a day.  Reappoint x2 weeks for suture removal

## 2013-04-02 ENCOUNTER — Telehealth: Payer: Self-pay | Admitting: *Deleted

## 2013-04-02 NOTE — Telephone Encounter (Signed)
Pt request change of surgery appt this Monday.

## 2013-04-03 NOTE — Telephone Encounter (Signed)
lmom to call us back and resch the appt

## 2013-04-03 NOTE — Telephone Encounter (Signed)
Message copied by Andres Ege on Thu Apr 03, 2013 10:17 AM ------      Message from: Bush, Janett Billow M      Created: Mon Mar 31, 2013 11:44 AM      Regarding: schedule sx      Contact: [phone]       Derotha, Fishbaugh is coming in on 04-07-13 for her second post-op visit and for a suture removal as well. Vaunda would like to go on and schedule for the foot in early February. Please call the patient in regards to this.            Thanks.            Janett Billow ------

## 2013-04-03 NOTE — Telephone Encounter (Signed)
Pt is scheduled for her right foot surgery on 04/22/2013, and will complete paperwork on 04/07/2013 at the time of her POV for her left.

## 2013-04-07 ENCOUNTER — Ambulatory Visit (INDEPENDENT_AMBULATORY_CARE_PROVIDER_SITE_OTHER): Payer: 59 | Admitting: *Deleted

## 2013-04-07 ENCOUNTER — Ambulatory Visit (INDEPENDENT_AMBULATORY_CARE_PROVIDER_SITE_OTHER): Payer: 59

## 2013-04-07 ENCOUNTER — Encounter: Payer: Self-pay | Admitting: *Deleted

## 2013-04-07 ENCOUNTER — Encounter: Payer: 59 | Admitting: Podiatry

## 2013-04-07 VITALS — BP 144/82 | HR 70 | Resp 16 | Ht 63.0 in | Wt 132.0 lb

## 2013-04-07 DIAGNOSIS — M21619 Bunion of unspecified foot: Secondary | ICD-10-CM

## 2013-04-07 DIAGNOSIS — Z9889 Other specified postprocedural states: Secondary | ICD-10-CM

## 2013-04-07 DIAGNOSIS — M2040 Other hammer toe(s) (acquired), unspecified foot: Secondary | ICD-10-CM

## 2013-04-07 DIAGNOSIS — M2010 Hallux valgus (acquired), unspecified foot: Secondary | ICD-10-CM

## 2013-04-07 NOTE — Progress Notes (Signed)
Pt states she is doing fine, ready for the sutures to be taken out and to get ready for her right foot surgery scheduled 04/22/2013.

## 2013-04-07 NOTE — Progress Notes (Signed)
Subjective:     Patient ID: Jody Lucas, female   DOB: 05/07/1955, 58 y.o.   MRN: [id number]  HPI patient states that she is doing very well post operatively and wants to get her other foot fixed in the next several weeks. She is walking with a cane and is able to ambulate quite well on her left foot   Review of Systems     Objective:   Physical Exam Neurovascular status intact with no health history changes noted and negative Homans sign noted. Surgical site left are healing well with mild edema no erythema no drainage noted and wound edges coapted well. Significant structural malalignment right foot with large bunion deformity metatarsal deformity and digital deformities of the second third and fourth toes right foot    Assessment:     Healing well from foot surgery left that would like to be more active so we will x-ray to confirm good structure and significant forefoot malalignment right    Plan:     Reviewed x-rays of left foot and will allow her to gradually return to saw shoe gear over the next several weeks with dispensing of Darco shoe today. Reviewed consent form for correction of right foot procedures to be Austin bunionectomy metatarsal osteotomy fifth right with screw and digital repair digits 23 and 4. Explained all possible complications that can occur and the fact there is no long-term guarantees of success of surgery. Patient wants procedure and signed consent form for right foot and is scheduled for outpatient surgery to be done at [facility] but she surgical center in the next several weeks after we answered all questions. Understands total recovery. To take 6 months to one year

## 2013-04-10 ENCOUNTER — Encounter: Payer: 59 | Admitting: Podiatry

## 2013-04-22 DIAGNOSIS — M2040 Other hammer toe(s) (acquired), unspecified foot: Secondary | ICD-10-CM

## 2013-04-22 DIAGNOSIS — M2010 Hallux valgus (acquired), unspecified foot: Secondary | ICD-10-CM

## 2013-04-22 DIAGNOSIS — M21619 Bunion of unspecified foot: Secondary | ICD-10-CM

## 2013-04-28 ENCOUNTER — Encounter: Payer: Self-pay | Admitting: Podiatry

## 2013-04-28 ENCOUNTER — Ambulatory Visit (INDEPENDENT_AMBULATORY_CARE_PROVIDER_SITE_OTHER): Payer: 59

## 2013-04-28 ENCOUNTER — Ambulatory Visit (INDEPENDENT_AMBULATORY_CARE_PROVIDER_SITE_OTHER): Payer: 59 | Admitting: Podiatry

## 2013-04-28 VITALS — BP 126/80 | HR 79 | Resp 12

## 2013-04-28 DIAGNOSIS — Z9889 Other specified postprocedural states: Secondary | ICD-10-CM

## 2013-04-28 DIAGNOSIS — M2040 Other hammer toe(s) (acquired), unspecified foot: Secondary | ICD-10-CM

## 2013-04-28 DIAGNOSIS — M2010 Hallux valgus (acquired), unspecified foot: Secondary | ICD-10-CM

## 2013-04-29 NOTE — Progress Notes (Signed)
1) Austin bunionectomy left foot  2) Tailors bunionectomy 5th left foot  3) Hammer toe 2nd toe left foot

## 2013-04-29 NOTE — Progress Notes (Signed)
Subjective:     Patient ID: Jody Lucas, female   DOB: 1955/07/03, 58 y.o.   MRN: [id number]  HPI Patient states I'm doing pretty well with my left foot still mildly swollen but ready for shoes in my right foot doing pretty well 1 week after surgery  Review of Systems     Objective:   Physical Exam Neurovascular status intact with negative Homans sign noted and good structure right foot forefoot after having surgery on the bunion and digits. Left foot is doing well with crusted tissue which is normal for this. Postop with minimal edema    Assessment:     Patient is progressing well with normal appearance of both feet for the. Postop but they are    Plan:     Reviewed x-rays of both feet and allowed to return to saw shoe gear on the left foot. Reapplied sterile dressing right and instructed on elevation continued immobilization compression and reappoint for stitch removal in 2 weeks earlier if needed for anything else

## 2013-05-07 ENCOUNTER — Ambulatory Visit: Payer: 59 | Admitting: Podiatry

## 2013-05-07 ENCOUNTER — Encounter: Payer: Self-pay | Admitting: Podiatry

## 2013-05-07 VITALS — BP 131/80 | HR 70 | Resp 12

## 2013-05-07 DIAGNOSIS — M2010 Hallux valgus (acquired), unspecified foot: Secondary | ICD-10-CM

## 2013-05-07 DIAGNOSIS — M2040 Other hammer toe(s) (acquired), unspecified foot: Secondary | ICD-10-CM

## 2013-05-09 NOTE — Progress Notes (Signed)
Subjective:     Patient ID: Jody Lucas, female   DOB: 07/04/1955, 58 y.o.   MRN: [id number]  HPI patient presents stating I am doing well and walking with moderate discomfort. States that she feels like this foot has bothered her more during the first few weeks than the other foot as far as surgery   Review of Systems     Objective:   Physical Exam Neurovascular status intact with negative Homans sign noted and well-healing surgical site right   and left foot with wound edges well: Acted no drainage mild to moderate edema and no erythema or lymph node distention Assessment:     Patient is proceeding well from surgery with no apparent reason why 1 foot may hurt slightly more than the other one    Plan:     X-rays reviewed of both feet and sterile dressing reapplied to the right foot. Instructed on range of motion exercises gradual increase in activity and reappoint in the next 4 weeks and less needs to be seen earlier when she is to come in

## 2013-05-26 ENCOUNTER — Ambulatory Visit: Payer: 59 | Admitting: Podiatry

## 2013-05-26 ENCOUNTER — Ambulatory Visit (INDEPENDENT_AMBULATORY_CARE_PROVIDER_SITE_OTHER): Payer: 59

## 2013-05-26 DIAGNOSIS — Z9889 Other specified postprocedural states: Secondary | ICD-10-CM

## 2013-05-26 DIAGNOSIS — M2010 Hallux valgus (acquired), unspecified foot: Secondary | ICD-10-CM

## 2013-05-26 DIAGNOSIS — M2040 Other hammer toe(s) (acquired), unspecified foot: Secondary | ICD-10-CM

## 2013-05-28 NOTE — Progress Notes (Signed)
Subjective:     Patient ID: Jody Lucas, female   DOB: 01-26-1956, 58 y.o.   MRN: [id number]  HPI patient states that I'm doing very well and able to walk more distances but I still gets swelling more in my right foot and my left foot.   Review of Systems     Objective:   Physical Exam Neurovascular status intact with negative Homans sign noted an approximate 5 weeks after surgery right and 10 weeks after surgery left. Incision sites are healed well with good alignment and excellent range of motion first MPJ that is free with no indications of restriction    Assessment:     Doing well post osteotomy first and fifth metatarsal of both feet    Plan:     X-rays reviewed and allow patient to increase activity at this time. Reappoint for recheck in the next several months

## 2013-05-29 ENCOUNTER — Encounter: Payer: Self-pay | Admitting: Gynecology

## 2013-05-29 ENCOUNTER — Ambulatory Visit (INDEPENDENT_AMBULATORY_CARE_PROVIDER_SITE_OTHER): Payer: 59 | Admitting: Gynecology

## 2013-05-29 VITALS — BP 120/70 | Ht 62.0 in | Wt 145.0 lb

## 2013-05-29 DIAGNOSIS — Z01419 Encounter for gynecological examination (general) (routine) without abnormal findings: Secondary | ICD-10-CM

## 2013-05-29 DIAGNOSIS — Z79890 Hormone replacement therapy (postmenopausal): Secondary | ICD-10-CM

## 2013-05-29 DIAGNOSIS — N83209 Unspecified ovarian cyst, unspecified side: Secondary | ICD-10-CM

## 2013-05-29 MED ORDER — ESTRADIOL 1 MG PO TABS: 1.0000 mg | ORAL_TABLET | Freq: Every day | ORAL | Status: DC

## 2013-05-29 NOTE — Progress Notes (Signed)
Jody Lucas Jul 27, 1955 [id number]        58 y.o.  G3P3003 for annual exam.  Doing well.  Past medical history,surgical history, problem list, medications, allergies, family history and social history were all reviewed and documented in the EPIC chart.  ROS:  Performed and pertinent positives and negatives are included in the history, assessment and plan .  Exam: Jody Lucas assistant Filed Vitals:   05/29/13 1455  BP: 120/70  Height: 5\' 2"  (1.575 m)  Weight: 145 lb (65.772 kg)   General appearance  Normal Skin grossly normal Head/Neck normal with no cervical or supraclavicular adenopathy thyroid normal Lungs  clear Cardiac RR, without RMG Abdominal  soft, nontender, without masses, organomegaly or hernia Breasts  examined lying and sitting without masses, retractions, discharge or axillary adenopathy. Pelvic  Ext/BUS/vagina with mild atrophic changes  Adnexa  Without masses or tenderness    Anus and perineum  Normal   Rectovaginal  Normal sphincter tone without palpated masses or tenderness.    Assessment/Plan:  58 y.o. G64P3003 female for annual exam.   1. Postmenopausal/HRT. Patient on 0.5 mg of Estrace daily. 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 Had been on 1 mg but decreased last year to 0 .5 milligrams and is tolerating this well. Issues as to whether to continue to wean or maintain this dose for now discussed.  I again reviewed the whole issue of HRT with her to include the WHI study with increased risk of stroke, heart attack, DVT and breast cancer. The ACOG and NAMS statements for lowest dose for the shortest period of time reviewed. At this point the patient was maintained at 0.5 mg and I refilled her x1 year. 2. Osteoporosis. Being followed by Dr. Joylene Lucas for this. I have no copies of her studies and she'll continue to followup with Dr Jody Lucas in reference to this.  3. Persistent left ovarian cyst. 31 mm avascular last  year. In review of her records this dates back to 2008 weren't measured 39 mm. Serial measurements show 2009 30.6 mm, 2011 26 millimeter.  She is having no symptoms. We've discussed this previously in the issues that we cannot guarantee that this is not an early ovarian cancer was discussed. Serial screening ultrasounds as well as CA 125 measurements reviewed. Given the stability over years measurement highly unlikely this is a malignancy. Offered repeat ultrasound now which patient declined. She feels comfortable with expectant management as long as her annual exams are normal to follow expectantly with the above disclaimer. 4. Pap smear 2014. No Pap smear done today. 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 Normal Pap smears since then. We'll plan 3 year screening interval for now. 5. Mammography 03/2013. Continue with annual mammography. SBE monthly reviewed. 6. Colonoscopy 2014. Repeat CAT are recommended interval. 7. Health maintenance. No blood work done as she reports is all done through Dr Jody Lucas office. Followup one year, sooner as needed.   Note: This document was prepared with digital dictation and possible smart phrase technology. Any transcriptional errors that result from this process are unintentional.   Jody Auerbach MD, 3:22 PM 05/29/2013

## 2013-05-29 NOTE — Patient Instructions (Signed)
Followup in one year, sooner as needed.  You may obtain a copy of any labs that were done today by logging onto MyChart as outlined in the instructions provided with your AVS (after visit summary). The office will not call with normal lab results but certainly if there are any significant abnormalities then we will contact you.   Health Maintenance, Female A healthy lifestyle and preventative care can promote health and wellness.  Maintain regular health, dental, and eye exams.  Eat a healthy diet. Foods like vegetables, fruits, whole grains, low-fat dairy products, and lean protein foods contain the nutrients you need without too many calories. Decrease your intake of foods high in solid fats, added sugars, and salt. Get information about a proper diet from your caregiver, if necessary.  Regular physical exercise is one of the most important things you can do for your health. Most adults should get at least 150 minutes of moderate-intensity exercise (any activity that increases your heart rate and causes you to sweat) each week. In addition, most adults need muscle-strengthening exercises on 2 or more days a week.   Maintain a healthy weight. The body mass index (BMI) is a screening tool to identify possible weight problems. It provides an estimate of body fat based on height and weight. Your caregiver can help determine your BMI, and can help you achieve or maintain a healthy weight. For adults 20 years and older:  A BMI below 18.5 is considered underweight.  A BMI of 18.5 to 24.9 is normal.  A BMI of 25 to 29.9 is considered overweight.  A BMI of 30 and above is considered obese.  Maintain normal blood lipids and cholesterol by exercising and minimizing your intake of saturated fat. Eat a balanced diet with plenty of fruits and vegetables. Blood tests for lipids and cholesterol should begin at age 41 and be repeated every 5 years. If your lipid or cholesterol levels are high, you are over  50, or you are a high risk for heart disease, you may need your cholesterol levels checked more frequently.Ongoing high lipid and cholesterol levels should be treated with medicines if diet and exercise are not effective.  If you smoke, find out from your caregiver how to quit. If you do not use tobacco, do not start.  Lung cancer screening is recommended for adults aged 49 80 years who are at high risk for developing lung cancer because of a history of smoking. Yearly low-dose computed tomography (CT) is recommended for people who have at least a 30-pack-year history of smoking and are a current smoker or have quit within the past 15 years. A pack year of smoking is smoking an average of 1 pack of cigarettes a day for 1 year (for example: 1 pack a day for 30 years or 2 packs a day for 15 years). Yearly screening should continue until the smoker has stopped smoking for at least 15 years. Yearly screening should also be stopped for people who develop a health problem that would prevent them from having lung cancer treatment.  If you are pregnant, do not drink alcohol. If you are breastfeeding, be very cautious about drinking alcohol. If you are not pregnant and choose to drink alcohol, do not exceed 1 drink per day. One drink is considered to be 12 ounces (355 mL) of beer, 5 ounces (148 mL) of wine, or 1.5 ounces (44 mL) of liquor.  Avoid use of street drugs. Do not share needles with anyone. Ask for help  if you need support or instructions about stopping the use of drugs.  High blood pressure causes heart disease and increases the risk of stroke. Blood pressure should be checked at least every 1 to 2 years. Ongoing high blood pressure should be treated with medicines, if weight loss and exercise are not effective.  If you are 55 to 60 years old, ask your caregiver if you should take aspirin to prevent strokes.  Diabetes screening involves taking a blood sample to check your fasting blood sugar level.  This should be done once every 3 years, after age 45, if you are within normal weight and without risk factors for diabetes. Testing should be considered at a younger age or be carried out more frequently if you are overweight and have at least 1 risk factor for diabetes.  Breast cancer screening is essential preventative care for women. You should practice "breast self-awareness." This means understanding the normal appearance and feel of your breasts and may include breast self-examination. Any changes detected, no matter how small, should be reported to a caregiver. Women in their 20s and 30s should have a clinical breast exam (CBE) by a caregiver as part of a regular health exam every 1 to 3 years. After age 40, women should have a CBE every year. Starting at age 40, women should consider having a mammogram (breast X-ray) every year. Women who have a family history of breast cancer should talk to their caregiver about genetic screening. Women at a high risk of breast cancer should talk to their caregiver about having an MRI and a mammogram every year.  Breast cancer gene (BRCA)-related cancer risk assessment is recommended for women who have family members with BRCA-related cancers. BRCA-related cancers include breast, ovarian, tubal, and peritoneal cancers. Having family members with these cancers may be associated with an increased risk for harmful changes (mutations) in the breast cancer genes BRCA1 and BRCA2. Results of the assessment will determine the need for genetic counseling and BRCA1 and BRCA2 testing.  The Pap test is a screening test for cervical cancer. Women should have a Pap test starting at age 21. Between ages 21 and 29, Pap tests should be repeated every 2 years. Beginning at age 30, you should have a Pap test every 3 years as long as the past 3 Pap tests have been normal. If you had a hysterectomy for a problem that was not cancer or a condition that could lead to cancer, then you no  longer need Pap tests. If you are between ages 65 and 70, and you have had normal Pap tests going back 10 years, you no longer need Pap tests. If you have had past treatment for cervical cancer or a condition that could lead to cancer, you need Pap tests and screening for cancer for at least 20 years after your treatment. If Pap tests have been discontinued, risk factors (such as a new sexual partner) need to be reassessed to determine if screening should be resumed. Some women have medical problems that increase the chance of getting cervical cancer. In these cases, your caregiver may recommend more frequent screening and Pap tests.  The human papillomavirus (HPV) test is an additional test that may be used for cervical cancer screening. The HPV test looks for the virus that can cause the cell changes on the cervix. The cells collected during the Pap test can be tested for HPV. The HPV test could be used to screen women aged 30 years and older, and   should be used in women of any age who have unclear Pap test results. After the age of 30, women should have HPV testing at the same frequency as a Pap test.  Colorectal cancer can be detected and often prevented. Most routine colorectal cancer screening begins at the age of 50 and continues through age 75. However, your caregiver may recommend screening at an earlier age if you have risk factors for colon cancer. On a yearly basis, your caregiver may provide home test kits to check for hidden blood in the stool. Use of a small camera at the end of a tube, to directly examine the colon (sigmoidoscopy or colonoscopy), can detect the earliest forms of colorectal cancer. Talk to your caregiver about this at age 50, when routine screening begins. Direct examination of the colon should be repeated every 5 to 10 years through age 75, unless early forms of pre-cancerous polyps or small growths are found.  Hepatitis C blood testing is recommended for all people born from  1945 through 1965 and any individual with known risks for hepatitis C.  Practice safe sex. Use condoms and avoid high-risk sexual practices to reduce the spread of sexually transmitted infections (STIs). Sexually active women aged 25 and younger should be checked for Chlamydia, which is a common sexually transmitted infection. Older women with new or multiple partners should also be tested for Chlamydia. Testing for other STIs is recommended if you are sexually active and at increased risk.  Osteoporosis is a disease in which the bones lose minerals and strength with aging. This can result in serious bone fractures. The risk of osteoporosis can be identified using a bone density scan. Women ages 65 and over and women at risk for fractures or osteoporosis should discuss screening with their caregivers. Ask your caregiver whether you should be taking a calcium supplement or vitamin D to reduce the rate of osteoporosis.  Menopause can be associated with physical symptoms and risks. Hormone replacement therapy is available to decrease symptoms and risks. You should talk to your caregiver about whether hormone replacement therapy is right for you.  Use sunscreen. Apply sunscreen liberally and repeatedly throughout the day. You should seek shade when your shadow is shorter than you. Protect yourself by wearing long sleeves, pants, a wide-brimmed hat, and sunglasses year round, whenever you are outdoors.  Notify your caregiver of new moles or changes in moles, especially if there is a change in shape or color. Also notify your caregiver if a mole is larger than the size of a pencil eraser.  Stay current with your immunizations. Document Released: 09/12/2010 Document Revised: 06/24/2012 Document Reviewed: 09/12/2010 [employer] Patient Information 2014 [employer].   

## 2013-05-30 LAB — URINALYSIS W MICROSCOPIC + REFLEX CULTURE
Bilirubin Urine: NEGATIVE
Casts: NONE SEEN
Crystals: NONE SEEN
Glucose, UA: NEGATIVE mg/dL
HGB URINE DIPSTICK: NEGATIVE
Ketones, ur: NEGATIVE mg/dL
NITRITE: NEGATIVE
Protein, ur: NEGATIVE mg/dL
Specific Gravity, Urine: 1.03 (ref 1.005–1.030)
UROBILINOGEN UA: 0.2 mg/dL (ref 0.0–1.0)
pH: 5.5 (ref 5.0–8.0)

## 2013-05-31 LAB — URINE CULTURE
COLONY COUNT: NO GROWTH
ORGANISM ID, BACTERIA: NO GROWTH

## 2013-06-30 ENCOUNTER — Ambulatory Visit (INDEPENDENT_AMBULATORY_CARE_PROVIDER_SITE_OTHER): Payer: 59 | Admitting: Podiatry

## 2013-06-30 ENCOUNTER — Encounter: Payer: Self-pay | Admitting: Podiatry

## 2013-06-30 ENCOUNTER — Ambulatory Visit (INDEPENDENT_AMBULATORY_CARE_PROVIDER_SITE_OTHER): Payer: 59

## 2013-06-30 ENCOUNTER — Telehealth: Payer: Self-pay | Admitting: *Deleted

## 2013-06-30 VITALS — BP 147/88 | HR 59 | Resp 16

## 2013-06-30 DIAGNOSIS — M2010 Hallux valgus (acquired), unspecified foot: Secondary | ICD-10-CM

## 2013-06-30 DIAGNOSIS — M2040 Other hammer toe(s) (acquired), unspecified foot: Secondary | ICD-10-CM

## 2013-06-30 NOTE — Telephone Encounter (Signed)
I had foot surgery in January and February.  I've been doing a lot of walking with my job.  I think I have over done it.  My foot is red and swollen.  A blister has developed where the incision is, I hope I haven't messed anything up.  Is there anyway I can be seen today.  I asked Dr. Paulla Dolly and he okayed for her to come in today.  I sent her to a scheduler.

## 2013-07-02 NOTE — Progress Notes (Signed)
Subjective:     Patient ID: Jody Lucas, female   DOB: 28-Feb-1956, 58 y.o.   MRN: [id number]  HPI patient states that been working the last couple weeks 12 hours a day and developed some swelling of my feet and the end of my incision sites bunion have become slightly inflamed and I was scared I may have infection   Review of Systems     Objective:   Physical Exam Neurovascular status intact with mild edema normal for this postop with no indications of excessive issues and I noted there slight irritation distal incision sites left over right bunion but no indications of hypertrophic or keloid formation    Assessment:     Patient has been on her feet excessively and developed mild/moderate edema which should reduce over time    Plan:     Reviewed x-rays and advised on elevation compression and ibuprofen to be taken on an aggressive nature. Reappoint her recheck again for regular visit

## 2013-07-09 ENCOUNTER — Encounter: Payer: 59 | Admitting: Podiatry

## 2013-08-11 ENCOUNTER — Ambulatory Visit (INDEPENDENT_AMBULATORY_CARE_PROVIDER_SITE_OTHER): Payer: 59 | Admitting: Podiatry

## 2013-08-11 ENCOUNTER — Encounter: Payer: Self-pay | Admitting: Podiatry

## 2013-08-11 ENCOUNTER — Ambulatory Visit (INDEPENDENT_AMBULATORY_CARE_PROVIDER_SITE_OTHER): Payer: 59

## 2013-08-11 ENCOUNTER — Encounter: Payer: 59 | Admitting: Podiatry

## 2013-08-11 VITALS — BP 127/82 | HR 60 | Resp 16

## 2013-08-11 DIAGNOSIS — M21619 Bunion of unspecified foot: Secondary | ICD-10-CM

## 2013-08-11 DIAGNOSIS — M722 Plantar fascial fibromatosis: Secondary | ICD-10-CM

## 2013-08-11 NOTE — Patient Instructions (Signed)

## 2013-08-11 NOTE — Progress Notes (Signed)
Subjective:     Patient ID: Jody Lucas, female   DOB: 11/10/1955, 58 y.o.   MRN: [id number]  HPI patient presents stating I'm doing pretty well but i'm developing some mild pain in the heels of both feet patient is several months after foot surgery on both feet   Review of Systems     Objective:   Physical Exam Neurovascular status intact with incision sites first metatarsal healing well on both feet with good alignment and range of motion of the first MPJ both feet. Discomfort in the plantar heel region of both feet of a moderate nature upon palpation    Assessment:     Healing well from foot surgery of both feet with plantar fasciitis of a moderate nature secondary to foot structure noted    Plan:     Reviewed x-rays and scanned for custom orthotic devices and discussed that inflammation while still present will continue to improve

## 2013-08-20 ENCOUNTER — Telehealth: Payer: Self-pay | Admitting: *Deleted

## 2013-08-20 NOTE — Telephone Encounter (Signed)
Jody Lucas!  I attempted to return her call, I left a message for a return call.

## 2013-08-20 NOTE — Telephone Encounter (Signed)
I missed your call.  My surgery on the left foot was in January and the one on the right foot was in February.  I'm going to beach.  Is it okay to get in the ocean or in the sand?  I called and left her a message that it's okay to get in the ocean and the sand as long as there is not any open lesions or wounds.

## 2013-09-01 ENCOUNTER — Encounter: Payer: Self-pay | Admitting: Podiatry

## 2013-09-01 ENCOUNTER — Ambulatory Visit (INDEPENDENT_AMBULATORY_CARE_PROVIDER_SITE_OTHER): Payer: 59

## 2013-09-01 ENCOUNTER — Ambulatory Visit (INDEPENDENT_AMBULATORY_CARE_PROVIDER_SITE_OTHER): Payer: 59 | Admitting: Podiatry

## 2013-09-01 VITALS — BP 122/86 | HR 86 | Resp 12

## 2013-09-01 DIAGNOSIS — M2010 Hallux valgus (acquired), unspecified foot: Secondary | ICD-10-CM

## 2013-09-01 DIAGNOSIS — M7750 Other enthesopathy of unspecified foot: Secondary | ICD-10-CM

## 2013-09-01 NOTE — Progress Notes (Signed)
Subjective:     Patient ID: Jody Lucas, female   DOB: October 26, 1955, 58 y.o.   MRN: [id number]  HPI patient presents stating I'm feeling real good but I still have a little swelling on the bottom of my feet and able to wear heeled shoes at this time. I also want to pickup my orthotics   Review of Systems     Objective:   Physical Exam Neurovascular status intact incision sites are healed well in good alignment of digits was noted at this time with mild edema in the lesser MPJs of both feet    Assessment:     Doing well with mild capsulitis tendinitis-like symptoms which should resolve over time    Plan:     Final x-rays reviewed with patient and dispensed orthotics with instructions on usage. Patient will be seen back as needed he was given instructions that there is any issues to please reappoint

## 2013-10-07 DIAGNOSIS — M7750 Other enthesopathy of unspecified foot: Secondary | ICD-10-CM

## 2013-11-06 ENCOUNTER — Encounter: Payer: Self-pay | Admitting: Women's Health

## 2013-11-06 ENCOUNTER — Ambulatory Visit (INDEPENDENT_AMBULATORY_CARE_PROVIDER_SITE_OTHER): Payer: 59 | Admitting: Women's Health

## 2013-11-06 DIAGNOSIS — N3001 Acute cystitis with hematuria: Secondary | ICD-10-CM

## 2013-11-06 DIAGNOSIS — N3000 Acute cystitis without hematuria: Secondary | ICD-10-CM

## 2013-11-06 DIAGNOSIS — R300 Dysuria: Secondary | ICD-10-CM

## 2013-11-06 LAB — URINALYSIS W MICROSCOPIC + REFLEX CULTURE
Bilirubin Urine: NEGATIVE
CASTS: NONE SEEN
Crystals: NONE SEEN
GLUCOSE, UA: NEGATIVE mg/dL
KETONES UR: NEGATIVE mg/dL
Nitrite: NEGATIVE
Protein, ur: NEGATIVE mg/dL
Specific Gravity, Urine: <1.005 — ABNORMAL LOW (ref 1.005–1.030)
Urobilinogen, UA: 0.2 mg/dL (ref 0.0–1.0)
pH: 5.5 (ref 5.0–8.0)

## 2013-11-06 MED ORDER — SULFAMETHOXAZOLE-TRIMETHOPRIM 800-160 MG PO TABS: 1.0000 | ORAL_TABLET | Freq: Two times a day (BID) | ORAL | Status: DC

## 2013-11-06 NOTE — Patient Instructions (Signed)

## 2013-11-06 NOTE — Progress Notes (Signed)
Patient ID: Jody Lucas, female   DOB: 1955-06-05, 58 y.o.   MRN: [id number] Presents with complaint of increased urinary frequency with urgency and burning for 2 days. Denies any vaginal discharge, abdominal pain or fever. TVH on Estrace 0.5 mg daily.  Exam: Appears well. No CVAT. UA small leukocytes, 21-50 WBCs, few bacteria.  Urinary tract infection  Plan: Septra twice daily for 3 days #6 prescription, proper use given and reviewed instructed to call if no relief of symptoms. UTI prevention discussed.

## 2013-11-09 LAB — URINE CULTURE

## 2014-01-12 ENCOUNTER — Encounter: Payer: Self-pay | Admitting: Women's Health

## 2014-02-11 ENCOUNTER — Other Ambulatory Visit: Payer: Self-pay | Admitting: Women's Health

## 2014-02-11 ENCOUNTER — Ambulatory Visit (INDEPENDENT_AMBULATORY_CARE_PROVIDER_SITE_OTHER): Payer: 59 | Admitting: Women's Health

## 2014-02-11 ENCOUNTER — Ambulatory Visit (INDEPENDENT_AMBULATORY_CARE_PROVIDER_SITE_OTHER): Payer: 59

## 2014-02-11 ENCOUNTER — Encounter: Payer: Self-pay | Admitting: Women's Health

## 2014-02-11 VITALS — BP 130/80 | Ht 63.0 in | Wt 146.0 lb

## 2014-02-11 DIAGNOSIS — N83202 Unspecified ovarian cyst, left side: Secondary | ICD-10-CM

## 2014-02-11 DIAGNOSIS — N838 Other noninflammatory disorders of ovary, fallopian tube and broad ligament: Secondary | ICD-10-CM

## 2014-02-11 DIAGNOSIS — R1031 Right lower quadrant pain: Secondary | ICD-10-CM

## 2014-02-11 DIAGNOSIS — N8320 Unspecified ovarian cysts: Secondary | ICD-10-CM

## 2014-02-11 DIAGNOSIS — N839 Noninflammatory disorder of ovary, fallopian tube and broad ligament, unspecified: Secondary | ICD-10-CM

## 2014-02-11 DIAGNOSIS — N898 Other specified noninflammatory disorders of vagina: Secondary | ICD-10-CM

## 2014-02-11 LAB — URINALYSIS W MICROSCOPIC + REFLEX CULTURE
BILIRUBIN URINE: NEGATIVE
Glucose, UA: NEGATIVE mg/dL
HGB URINE DIPSTICK: NEGATIVE
KETONES UR: NEGATIVE mg/dL
Leukocytes, UA: NEGATIVE
NITRITE: NEGATIVE
PROTEIN: NEGATIVE mg/dL
Specific Gravity, Urine: <1.005 — ABNORMAL LOW (ref 1.005–1.030)
UROBILINOGEN UA: 0.2 mg/dL (ref 0.0–1.0)
pH: 5.5 (ref 5.0–8.0)

## 2014-02-11 LAB — WET PREP FOR TRICH, YEAST, CLUE
Clue Cells Wet Prep HPF POC: NONE SEEN
Trich, Wet Prep: NONE SEEN
WBC, Wet Prep HPF POC: NONE SEEN
Yeast Wet Prep HPF POC: NONE SEEN

## 2014-02-11 NOTE — Progress Notes (Signed)
Patient ID: SYRINA WAKE, female   DOB: 1955-12-13, 58 y.o.   MRN: [id number] Presents with complaint of low abdominal pain that started in the middle of the night mostly on the left side some on the right. Reports able to work today, pain  increased with sitting decreases with lying down. Reports pain as an 8 earlier now a 7. Denies constipation, nausea, fever, pain or burning with urination. Scant vaginal discharge. History of 31 mm mean left ovarian cyst. Hysterectomy.  Exam: Appears well. No CVAT, abdomen soft, no radiation of pain with palpation, mild discomfort with pressure, no rebound. Ultrasound: Status post hysterectomy. Right ovary with hypo-echoic cyst internal low level echoes positive CFD to this area 11 x 10 mm. Left ovary thin-walled echo-free cyst 36 x 27 x 26 mm 30 mm mean, unchanged, echogenic focus walled cyst 2.4 mm avascular. Free fluid surrounds left ovary 47 x 27 x 33 mm.  Pelvic pain Probable ruptured cyst Persistent left ovarian cyst unchanged size  Plan: CA-125. Motrin 600 as needed for pain every 8 hours, follow-up  if pain persists or changes.

## 2014-02-12 ENCOUNTER — Other Ambulatory Visit: Payer: Self-pay | Admitting: Women's Health

## 2014-02-12 DIAGNOSIS — N83202 Unspecified ovarian cyst, left side: Secondary | ICD-10-CM

## 2014-02-12 LAB — CA 125: CA 125: 20 U/mL (ref <35)

## 2014-03-16 ENCOUNTER — Other Ambulatory Visit: Payer: Self-pay | Admitting: Women's Health

## 2014-03-16 DIAGNOSIS — N83202 Unspecified ovarian cyst, left side: Secondary | ICD-10-CM

## 2014-03-25 ENCOUNTER — Ambulatory Visit: Payer: 59 | Admitting: Women's Health

## 2014-03-25 ENCOUNTER — Other Ambulatory Visit: Payer: 59

## 2014-03-25 ENCOUNTER — Encounter: Payer: Self-pay | Admitting: Women's Health

## 2014-03-25 ENCOUNTER — Ambulatory Visit (INDEPENDENT_AMBULATORY_CARE_PROVIDER_SITE_OTHER): Payer: 59 | Admitting: Women's Health

## 2014-03-25 ENCOUNTER — Ambulatory Visit (INDEPENDENT_AMBULATORY_CARE_PROVIDER_SITE_OTHER): Payer: 59

## 2014-03-25 VITALS — BP 136/80 | Ht 63.0 in | Wt 146.0 lb

## 2014-03-25 DIAGNOSIS — N8320 Unspecified ovarian cysts: Secondary | ICD-10-CM

## 2014-03-25 DIAGNOSIS — N83202 Unspecified ovarian cyst, left side: Secondary | ICD-10-CM

## 2014-03-25 NOTE — Progress Notes (Signed)
Patient ID: Jody Lucas, female   DOB: 09-13-1955, 59 y.o.   MRN: [id number] Presents for follow-up ultrasound of left ovarian complex cyst, Ca  125  20. Status post hysterectomy. States has never had hot flushes or menopausal symptoms, questions if menopausal. Pain free.  Exam: Appears well.  Ultrasound: Status post hysterectomy. Right ovary normal. Left ovary previous complex cyst not seen. Thin-walled echo-free simple cyst 33 x 29 x 32 mm 31 mm mean avascular. Negative cul-de-sac.  Resolved  left ovarian cyst. 3 cm simple cyst left ovary  Plan: FSH, CA 125, if  Normal, repeat ultrasound in 3 months. Discussed most likely menopausal although symptom free.

## 2014-03-25 NOTE — Patient Instructions (Signed)
Ovarian Cyst An ovarian cyst is a fluid-filled sac that forms on an ovary. The ovaries are small organs that produce eggs in women. Various types of cysts can form on the ovaries. Most are not cancerous. Many do not cause problems, and they often go away on their own. Some may cause symptoms and require treatment. Common types of ovarian cysts include:  Functional cysts--These cysts may occur every month during the menstrual cycle. This is normal. The cysts usually go away with the next menstrual cycle if the woman does not get pregnant. Usually, there are no symptoms with a functional cyst.  Endometrioma cysts--These cysts form from the tissue that lines the uterus. They are also called "chocolate cysts" because they become filled with blood that turns brown. This type of cyst can cause pain in the lower abdomen during intercourse and with your menstrual period.  Cystadenoma cysts--This type develops from the cells on the outside of the ovary. These cysts can get very big and cause lower abdomen pain and pain with intercourse. This type of cyst can twist on itself, cut off its blood supply, and cause severe pain. It can also easily rupture and cause a lot of pain.  Dermoid cysts--This type of cyst is sometimes found in both ovaries. These cysts may contain different kinds of body tissue, such as skin, teeth, hair, or cartilage. They usually do not cause symptoms unless they get very big.  Theca lutein cysts--These cysts occur when too much of a certain hormone (human chorionic gonadotropin) is produced and overstimulates the ovaries to produce an egg. This is most common after procedures used to assist with the conception of a baby (in vitro fertilization). CAUSES   Fertility drugs can cause a condition in which multiple large cysts are formed on the ovaries. This is called ovarian hyperstimulation syndrome.  A condition called polycystic ovary syndrome can cause hormonal imbalances that can lead to  nonfunctional ovarian cysts. SIGNS AND SYMPTOMS  Many ovarian cysts do not cause symptoms. If symptoms are present, they may include:  Pelvic pain or pressure.  Pain in the lower abdomen.  Pain during sexual intercourse.  Increasing girth (swelling) of the abdomen.  Abnormal menstrual periods.  Increasing pain with menstrual periods.  Stopping having menstrual periods without being pregnant. DIAGNOSIS  These cysts are commonly found during a routine or annual pelvic exam. Tests may be ordered to find out more about the cyst. These tests may include:  Ultrasound.  X-ray of the pelvis.  CT scan.  MRI.  Blood tests. TREATMENT  Many ovarian cysts go away on their own without treatment. Your health care provider may want to check your cyst regularly for 2-3 months to see if it changes. For women in menopause, it is particularly important to monitor a cyst closely because of the higher rate of ovarian cancer in menopausal women. When treatment is needed, it may include any of the following:  A procedure to drain the cyst (aspiration). This may be done using a long needle and ultrasound. It can also be done through a laparoscopic procedure. This involves using a thin, lighted tube with a tiny camera on the end (laparoscope) inserted through a small incision.  Surgery to remove the whole cyst. This may be done using laparoscopic surgery or an open surgery involving a larger incision in the lower abdomen.  Hormone treatment or birth control pills. These methods are sometimes used to help dissolve a cyst. HOME CARE INSTRUCTIONS   Only take over-the-counter   or prescription medicines as directed by your health care provider.  Follow up with your health care provider as directed.  Get regular pelvic exams and Pap tests. SEEK MEDICAL CARE IF:   Your periods are late, irregular, or painful, or they stop.  Your pelvic pain or abdominal pain does not go away.  Your abdomen becomes  larger or swollen.  You have pressure on your bladder or trouble emptying your bladder completely.  You have pain during sexual intercourse.  You have feelings of fullness, pressure, or discomfort in your stomach.  You lose weight for no apparent reason.  You feel generally ill.  You become constipated.  You lose your appetite.  You develop acne.  You have an increase in body and facial hair.  You are gaining weight, without changing your exercise and eating habits.  You think you are pregnant. SEEK IMMEDIATE MEDICAL CARE IF:   You have increasing abdominal pain.  You feel sick to your stomach (nauseous), and you throw up (vomit).  You develop a fever that comes on suddenly.  You have abdominal pain during a bowel movement.  Your menstrual periods become heavier than usual. MAKE SURE YOU:  Understand these instructions.  Will watch your condition.  Will get help right away if you are not doing well or get worse. Document Released: 02/27/2005 Document Revised: 03/04/2013 Document Reviewed: 11/04/2012 [employer] Patient Information 2015 [employer]. This information is not intended to replace advice given to you by your health care provider. Make sure you discuss any questions you have with your health care provider.  

## 2014-03-26 LAB — FOLLICLE STIMULATING HORMONE: FSH: 79.8 m[IU]/mL

## 2014-03-26 LAB — CA 125: CA 125: 20 U/mL (ref <35)

## 2014-04-16 ENCOUNTER — Ambulatory Visit (INDEPENDENT_AMBULATORY_CARE_PROVIDER_SITE_OTHER): Payer: 59

## 2014-04-16 ENCOUNTER — Ambulatory Visit (INDEPENDENT_AMBULATORY_CARE_PROVIDER_SITE_OTHER): Payer: 59 | Admitting: Podiatry

## 2014-04-16 ENCOUNTER — Encounter: Payer: Self-pay | Admitting: Podiatry

## 2014-04-16 VITALS — BP 165/92 | HR 58 | Resp 11

## 2014-04-16 DIAGNOSIS — M2010 Hallux valgus (acquired), unspecified foot: Secondary | ICD-10-CM

## 2014-04-17 ENCOUNTER — Encounter: Payer: Self-pay | Admitting: Gynecology

## 2014-04-17 NOTE — Progress Notes (Signed)
Subjective:     Patient ID: Jody Lucas, female   DOB: 05-Jun-1955, 59 y.o.   MRN: [id number]  HPI patient presents stating I'm very pleased with how I'm doing but I still get a little bit of swelling if him on them too much   Review of Systems     Objective:   Physical Exam Neurovascular status intact with well-healing surgical sites first and fifth metatarsals of both feet with excellent motion of the first MPJ bilateral and no crepitus noted    Assessment:     Doing well post structural correction of both feet    Plan:     Reviewed condition and recommended continued orthotic usage and gradual return to all shoe gear. Reappoint if symptoms indicate

## 2014-05-11 DIAGNOSIS — H11159 Pinguecula, unspecified eye: Secondary | ICD-10-CM | POA: Insufficient documentation

## 2014-05-18 ENCOUNTER — Other Ambulatory Visit: Payer: Self-pay | Admitting: Women's Health

## 2014-05-18 DIAGNOSIS — N83202 Unspecified ovarian cyst, left side: Secondary | ICD-10-CM

## 2014-05-26 ENCOUNTER — Ambulatory Visit (INDEPENDENT_AMBULATORY_CARE_PROVIDER_SITE_OTHER): Payer: 59 | Admitting: Gynecology

## 2014-05-26 ENCOUNTER — Encounter: Payer: Self-pay | Admitting: Gynecology

## 2014-05-26 VITALS — BP 120/76

## 2014-05-26 DIAGNOSIS — R102 Pelvic and perineal pain: Secondary | ICD-10-CM | POA: Diagnosis not present

## 2014-05-26 DIAGNOSIS — N83201 Unspecified ovarian cyst, right side: Secondary | ICD-10-CM

## 2014-05-26 DIAGNOSIS — N8320 Unspecified ovarian cysts: Secondary | ICD-10-CM | POA: Diagnosis not present

## 2014-05-26 DIAGNOSIS — N83202 Unspecified ovarian cyst, left side: Secondary | ICD-10-CM

## 2014-05-26 LAB — URINALYSIS W MICROSCOPIC + REFLEX CULTURE
Bilirubin Urine: NEGATIVE
Glucose, UA: NEGATIVE mg/dL
Hgb urine dipstick: NEGATIVE
Ketones, ur: NEGATIVE mg/dL
Leukocytes, UA: NEGATIVE
Nitrite: NEGATIVE
Protein, ur: NEGATIVE mg/dL
Specific Gravity, Urine: 1.01 (ref 1.005–1.030)
UROBILINOGEN UA: 0.2 mg/dL (ref 0.0–1.0)
pH: 6.5 (ref 5.0–8.0)

## 2014-05-26 NOTE — Patient Instructions (Signed)
Follow up for repeat ultrasound in April as scheduled.

## 2014-05-26 NOTE — Progress Notes (Signed)
Jody Lucas 12/16/1955 [id number]        58 y.o.  G3P3003 presents having seen Izora Gala in December complaining of pelvic pain. Ultrasound showed left ovarian cyst at 30 mm. History of left ovarian cyst followed since 2008 initially at 39 mm most recent 2014 at 31 mm without pain at that time and was being followed expectantly with normal CA 125. Patient's pain was acute and was thought due to a ruptured cyst and she was getting better and the plan was for expectant management. A CA-125 was also 20. She had a repeat ultrasound in January where the left ovarian cyst was no longer seen but she now has a 31 mm right ovarian cyst. FSH 79. Patient is on ERT estradiol 0.5 mg daily.  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 Patient notes her pain has persisted since onset as a dull ache that occurs daily mostly on the right lower quadrant but has some radiation to the left. Never acute chest nagging in nature. No nausea vomiting constipation diarrhea. No urinary symptoms as frequency dysuria or urgency. Has a follow up ultrasound scheduled the first week of April by Izora Gala.  Past medical history,surgical history, problem list, medications, allergies, family history and social history were all reviewed and documented in the EPIC chart.  Directed ROS with pertinent positives and negatives documented in the history of present illness/assessment and plan.  Exam: [name] assistant Filed Vitals:   05/26/14 1330  BP: 120/76   General appearance:  Normal Spine straight without CVA tenderness Abdomen soft nontender without masses guarding rebound Pelvic external BUS vagina normal. Bimanual without masses or tenderness. Rectovaginal exam is normal.  Assessment/Plan:  59 y.o. G3P3003 bilateral ovarian cystic changes first on the left now on the right. Small avascular. CA-125 negative. Unusual an 59 year old to have ovarian function. Continued discomfort of the last several months.  Differential to include non-GYN, benign cystic changes, ovarian malignancy all reviewed. Given the total picture my recommendation is to proceed with laparoscopic bilateral salpingo-oophorectomy. Patient at this point is reluctant to schedule but prefers to wait and recheck her ultrasound in the first week of April as already scheduled. If her pain persists she agrees to move towards surgery. If the cystic changes persist or enlarge she again agrees to move toward surgery. Patient initially wanted to wait until July. I reviewed with her the ultimate pathology benign and that's fine but if malignant then sooner surgery would be preferred. Patient understands the issues and will follow up in April for the ultrasound.     Anastasio Auerbach MD, 2:00 PM 05/26/2014

## 2014-06-09 ENCOUNTER — Other Ambulatory Visit: Payer: Self-pay | Admitting: Gynecology

## 2014-06-09 DIAGNOSIS — N83202 Unspecified ovarian cyst, left side: Secondary | ICD-10-CM

## 2014-06-17 ENCOUNTER — Encounter: Payer: 59 | Admitting: Women's Health

## 2014-06-17 ENCOUNTER — Ambulatory Visit: Payer: 59 | Admitting: Gynecology

## 2014-06-17 ENCOUNTER — Other Ambulatory Visit: Payer: 59

## 2014-06-25 ENCOUNTER — Ambulatory Visit (INDEPENDENT_AMBULATORY_CARE_PROVIDER_SITE_OTHER): Payer: 59

## 2014-06-25 ENCOUNTER — Encounter: Payer: Self-pay | Admitting: Gynecology

## 2014-06-25 ENCOUNTER — Ambulatory Visit (INDEPENDENT_AMBULATORY_CARE_PROVIDER_SITE_OTHER): Payer: 59 | Admitting: Gynecology

## 2014-06-25 VITALS — BP 120/76

## 2014-06-25 DIAGNOSIS — N8320 Unspecified ovarian cysts: Secondary | ICD-10-CM | POA: Diagnosis not present

## 2014-06-25 DIAGNOSIS — N83202 Unspecified ovarian cyst, left side: Secondary | ICD-10-CM

## 2014-06-25 NOTE — Progress Notes (Signed)
Jody Lucas 10-16-1955 [id number]        59 y.o.  G3P3003 Presents for follow up ultrasound. History of persistent small 30-32 mm echo-free avascular left ovarian cyst followed over the past 2 years unchanged. Has had some other cystic changes come and go on her ovaries.  CA-125 was 20. FSH was 79.  Past medical history,surgical history, problem list, medications, allergies, family history and social history were all reviewed and documented in the EPIC chart.  Directed ROS with pertinent positives and negatives documented in the history of present illness/assessment and plan.  Exam:  Filed Vitals:   06/25/14 1502  BP: 120/76   Ultrasound shows right ovary with small follicles. Left ovary with persistent thin-walled 32 mm mean avascular cyst. Cul-de-sac negative. Status post hysterectomy.  Assessment/Plan:  59 y.o. [id number] with small persistent left ovarian cyst unchanged over several years. Patient without significant pain or symptoms. She does note some diffuse abdominal discomfort that comes and goes but not consistently. She is also being followed for IBS. Notes her bowel movements are not regular. Not having any urinary symptoms to suggest  Interstitial cystitis. Options to include laparoscopy now with removal of her ovaries versus continued observation discussed. Patient's comfortable with continued observation and does not want surgery. I did recommend follow up ultrasound in 6 months. She is due for her annual exam now and is going to schedule this and then a follow up ultrasound in 6 months.    Anastasio Auerbach MD, 3:23 PM 06/25/2014

## 2014-06-25 NOTE — Patient Instructions (Signed)
Schedule your annual exam. We will plan on doing a follow up ultrasound in 6 months.

## 2014-07-12 ENCOUNTER — Other Ambulatory Visit: Payer: Self-pay | Admitting: Gynecology

## 2014-08-21 ENCOUNTER — Encounter: Payer: 59 | Admitting: Gynecology

## 2014-09-09 ENCOUNTER — Other Ambulatory Visit: Payer: Self-pay | Admitting: Gynecology

## 2014-11-30 ENCOUNTER — Ambulatory Visit (INDEPENDENT_AMBULATORY_CARE_PROVIDER_SITE_OTHER): Payer: 59 | Admitting: Gynecology

## 2014-11-30 ENCOUNTER — Encounter: Payer: Self-pay | Admitting: Gynecology

## 2014-11-30 VITALS — BP 130/80 | Ht 62.5 in | Wt 136.0 lb

## 2014-11-30 DIAGNOSIS — Z01419 Encounter for gynecological examination (general) (routine) without abnormal findings: Secondary | ICD-10-CM | POA: Diagnosis not present

## 2014-11-30 DIAGNOSIS — N952 Postmenopausal atrophic vaginitis: Secondary | ICD-10-CM | POA: Diagnosis not present

## 2014-11-30 NOTE — Patient Instructions (Signed)

## 2014-11-30 NOTE — Progress Notes (Signed)
Jody Lucas Aug 13, 1955 [id number]        59 y.o.  G3P3003 for annual exam.  Doing well without complaints.  Past medical history,surgical history, problem list, medications, allergies, family history and social history were all reviewed and documented as reviewed in the EPIC chart.  ROS:  Performed with pertinent positives and negatives included in the history, assessment and plan.   Additional significant findings :  none   Exam: [name] Counsellor Vitals:   11/30/14 1443  BP: 130/80  Height: 5' 2.5" (1.588 m)  Weight: 136 lb (61.689 kg)   General appearance:  Normal affect, orientation and appearance. Skin: Grossly normal HEENT: Without gross lesions.  No cervical or supraclavicular adenopathy. Thyroid normal.  Lungs:  Clear without wheezing, rales or rhonchi Cardiac: RR, without RMG Abdominal:  Soft, nontender, without masses, guarding, rebound, organomegaly or hernia Breasts:  Examined lying and sitting without masses, retractions, discharge or axillary adenopathy. Pelvic:  Ext/BUS/vagina normal with mild atrophic changes  Adnexa  Without masses or tenderness    Anus and perineum  Normal   Rectovaginal  Normal sphincter tone without palpated masses or tenderness.    Assessment/Plan:  59 y.o. G11P3003 female for annual exam.   1. 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 Had been on estradiol 0.5 mg but weaned herself off and is doing well without significant hot flushes, night sweats or vaginal dryness. We'll continue to monitor now and follow up if any issues. 2. Persistent left ovarian cyst.  Patient has history of a persistent 30-32 mm echo-free avascular left ovarian cyst followed over the last 3 years unchanged. CA 125 was 20 FSH was elevated at 79. Patient's not having any pain or complaints. Options to stop screening or continue with ultrasound surveillance reviewed. If she does choose ultrasound and will repeat this this  coming spring a year interval from her last ultrasound. Patient will call if she wants to pursue this. If she is comfortable with no further screening then we will monitor. 3. Mammography 04/2014. Continue with annual mammography. SBE monthly reviewed. 4. Pap smear 2014. No Pap smear done today.  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 Plan repeat Pap smear next year at three-year interval. 5. Colonoscopy 2014. Repeat at their recommended interval. 6. Osteoporosis is toward the. Followed by Dr. Joylene Draft with DEXA done 2 weeks ago. She was told that it is stable and they are not treating her actively with medication.  She'll continue follow up with them in reference to this. 7. Health maintenance. No lab work done as this is done at Dr. Silvestre Mesi office. Follow up 1 year, sooner as needed.  Anastasio Auerbach MD, 3:10 PM 11/30/2014

## 2015-04-21 ENCOUNTER — Encounter: Payer: Self-pay | Admitting: Gynecology

## 2015-11-18 DIAGNOSIS — B0229 Other postherpetic nervous system involvement: Secondary | ICD-10-CM | POA: Insufficient documentation

## 2015-12-03 DIAGNOSIS — R809 Proteinuria, unspecified: Secondary | ICD-10-CM | POA: Insufficient documentation

## 2015-12-16 ENCOUNTER — Ambulatory Visit (INDEPENDENT_AMBULATORY_CARE_PROVIDER_SITE_OTHER): Payer: 59 | Admitting: Gynecology

## 2015-12-16 ENCOUNTER — Encounter: Payer: Self-pay | Admitting: Gynecology

## 2015-12-16 VITALS — BP 120/74 | Ht 62.2 in | Wt 136.0 lb

## 2015-12-16 DIAGNOSIS — N952 Postmenopausal atrophic vaginitis: Secondary | ICD-10-CM | POA: Diagnosis not present

## 2015-12-16 DIAGNOSIS — R1030 Lower abdominal pain, unspecified: Secondary | ICD-10-CM

## 2015-12-16 DIAGNOSIS — Z01419 Encounter for gynecological examination (general) (routine) without abnormal findings: Secondary | ICD-10-CM

## 2015-12-16 DIAGNOSIS — Z8742 Personal history of other diseases of the female genital tract: Secondary | ICD-10-CM | POA: Diagnosis not present

## 2015-12-16 NOTE — Patient Instructions (Signed)
Follow up for ultrasound as scheduled 

## 2015-12-16 NOTE — Progress Notes (Signed)
    Jody Lucas 05-24-1955 [id number]        60 y.o.  G3P3003  for annual exam.  Several issues noted below.  Past medical history,surgical history, problem list, medications, allergies, family history and social history were all reviewed and documented as reviewed in the EPIC chart.  ROS:  Performed with pertinent positives and negatives included in the history, assessment and plan.   Additional significant findings :  None   Exam: Jody Lucas assistant Vitals:   12/16/15 1145  BP: 120/74  Weight: 136 lb (61.7 kg)  Height: 5' 2.2" (1.58 m)   Body mass index is 24.72 kg/m.  General appearance:  Normal affect, orientation and appearance. Skin: Grossly normalExcepting resolving herpes zoster rash upper mid abdomen below the left breast HEENT: Without gross lesions.  No cervical or supraclavicular adenopathy. Thyroid normal.  Lungs:  Clear without wheezing, rales or rhonchi Cardiac: RR, without RMG Abdominal:  Soft, nontender, without masses, guarding, rebound, organomegaly or hernia Breasts:  Examined lying and sitting without masses, retractions, discharge or axillary adenopathy. Pelvic:  Ext, BUS, Vagina normal with atrophic changes  Adnexa without masses or tenderness    Anus and perineum normal   Rectovaginal normal sphincter tone without palpated masses or tenderness.    Assessment/Plan:  60 y.o. G3P3003 female for annual exam.   1. Lower abdominal pain. Over the past several months patient knows low abdominal pain that comes and goes both aching and sharp stabbing. Last minutes to hour. No nausea vomiting diarrhea constipation. Relates a history though of IBS in the past. No urinary symptoms such as frequency dysuria or urgency.  Does have a history of a persistent left ovarian cyst that had been followed for several years in the 30-32 mm range echo-free avascular unchanged with normal CA-125. Pelvic exam is normal. Will start with pelvic ultrasound now to assess for  nonpalpable abnormalities. Assuming normal I recommended that patient follow up with GI if it persists. Patient will follow up with me after her ultrasound. 2. Recent bout of shingles anterior left upper abdomen. Now resolving. She did receive the shingles vaccine. Will follow up with Dr. Joylene Draft for management long-term. 3. Postmenopausal/atrophic changes. No significant hot flushes, night sweats, vaginal dryness. Had been on ERT in the past but discontinued and is doing well without this. 4. Pap smear 2014. Pap smear of vaginal cuff today. History of HGSIL with vaginal hysterectomy 1988 with normal Pap smears afterwards. 5. Mammography 04/2015. Continue with annual mammography when due. SBE monthly. 6. DEXA 2016. I have no copies of these reports. She is actively being managed by Dr. Joylene Draft. Notes being told she does have osteoporosis takes no medications at this point. She'll continue follow up with him for management. 7. Colonoscopy 2014. Repeat at their recommended interval. 8. Health maintenance. No routine lab work done as this is done at Dr. Silvestre Mesi office. Follow up for ultrasound otherwise annual exam in one year.   Jody Lucas, 12:11 PM 12/16/2015

## 2015-12-16 NOTE — Addendum Note (Signed)
Addended by: Nelva Nay on: 12/16/2015 12:26 PM   Modules accepted: Orders

## 2015-12-17 LAB — URINALYSIS W MICROSCOPIC + REFLEX CULTURE
BILIRUBIN URINE: NEGATIVE
Bacteria, UA: NONE SEEN [HPF]
Casts: NONE SEEN [LPF]
Crystals: NONE SEEN [HPF]
GLUCOSE, UA: NEGATIVE
Hgb urine dipstick: NEGATIVE
KETONES UR: NEGATIVE
Leukocytes, UA: NEGATIVE
NITRITE: NEGATIVE
PH: 7.5 (ref 5.0–8.0)
Protein, ur: NEGATIVE
RBC / HPF: NONE SEEN RBC/HPF (ref <=2)
SPECIFIC GRAVITY, URINE: 1.006 (ref 1.001–1.035)
Squamous Epithelial / LPF: NONE SEEN [HPF] (ref <=5)
WBC UA: NONE SEEN WBC/HPF (ref <=5)
Yeast: NONE SEEN [HPF]

## 2015-12-17 LAB — PAP IG W/ RFLX HPV ASCU

## 2016-01-05 ENCOUNTER — Encounter: Payer: Self-pay | Admitting: Gynecology

## 2016-01-05 ENCOUNTER — Ambulatory Visit (INDEPENDENT_AMBULATORY_CARE_PROVIDER_SITE_OTHER): Payer: 59

## 2016-01-05 ENCOUNTER — Ambulatory Visit (INDEPENDENT_AMBULATORY_CARE_PROVIDER_SITE_OTHER): Payer: 59 | Admitting: Gynecology

## 2016-01-05 ENCOUNTER — Other Ambulatory Visit: Payer: Self-pay | Admitting: Gynecology

## 2016-01-05 VITALS — BP 118/76

## 2016-01-05 DIAGNOSIS — N838 Other noninflammatory disorders of ovary, fallopian tube and broad ligament: Secondary | ICD-10-CM

## 2016-01-05 DIAGNOSIS — Z780 Asymptomatic menopausal state: Secondary | ICD-10-CM

## 2016-01-05 DIAGNOSIS — R102 Pelvic and perineal pain: Secondary | ICD-10-CM

## 2016-01-05 DIAGNOSIS — N839 Noninflammatory disorder of ovary, fallopian tube and broad ligament, unspecified: Secondary | ICD-10-CM | POA: Diagnosis not present

## 2016-01-05 DIAGNOSIS — N83202 Unspecified ovarian cyst, left side: Secondary | ICD-10-CM | POA: Diagnosis not present

## 2016-01-05 DIAGNOSIS — R1030 Lower abdominal pain, unspecified: Secondary | ICD-10-CM

## 2016-01-05 NOTE — Patient Instructions (Signed)
Follow up with her gastroenterologist for further evaluation of your pain

## 2016-01-05 NOTE — Progress Notes (Signed)
    Jody Lucas 26-Nov-1955 [id number]        60 y.o.  G3P3003 presents for ultrasound with history of lower abdominal pain. Comes and goes both aching and sharp stabbing. Last minutes to hours. No nausea vomiting diarrhea constipation. Does have a history of IBS in the past. No urinary symptoms. Past history of persistent left ovarian cyst in the 30-32 mm range, avascular with normal CA-125. Urinalysis negative  Past medical history,surgical history, problem list, medications, allergies, family history and social history were all reviewed and documented in the EPIC chart.  Directed ROS with pertinent positives and negatives documented in the history of present illness/assessment and plan.  Exam: Vitals:   01/05/16 1101  BP: 118/76   General appearance:  Normal  Ultrasound transvaginal status post hysterectomy. Right ovary normal. Left ovary with thin-walled echo-free 33 mm cyst with some echogenic debris. Negative color-flow. Small calcification noted 4 mm. Cul-de-sac negative  Assessment/Plan:  60 y.o. G3P3003 with persistent left ovarian cyst with some echogenic debris unchanged over years scanning in the 30 mm range. Avascular. Consistent with benign etiology, possible old endometrioma. Reviewed with patient that I doubt this is the etiology of her pain given the small size and stability over years monitoring with serial negative CA-125's in the past. Recommend that she follow up with her gastroenterologist in reference to her lower abdominal discomfort given her history of IBS. Having no urinary symptoms and a negative UA doubt urologic. Patient will follow up with the gastroenterologist. Will follow up with me routinely when due for annual exam.    Anastasio Auerbach MD, 11:17 AM 01/05/2016

## 2016-03-13 HISTORY — PX: CATARACT EXTRACTION W/ INTRAOCULAR LENS IMPLANT: SHX1309

## 2016-04-19 ENCOUNTER — Encounter: Payer: Self-pay | Admitting: Gynecology

## 2016-12-21 ENCOUNTER — Encounter: Payer: 59 | Admitting: Gynecology

## 2017-01-15 DIAGNOSIS — M79641 Pain in right hand: Secondary | ICD-10-CM | POA: Insufficient documentation

## 2017-02-06 ENCOUNTER — Ambulatory Visit: Payer: 59 | Admitting: Gynecology

## 2017-02-06 ENCOUNTER — Encounter: Payer: Self-pay | Admitting: Gynecology

## 2017-02-06 VITALS — BP 118/76 | Ht 62.0 in | Wt 151.0 lb

## 2017-02-06 DIAGNOSIS — N952 Postmenopausal atrophic vaginitis: Secondary | ICD-10-CM | POA: Diagnosis not present

## 2017-02-06 DIAGNOSIS — Z01411 Encounter for gynecological examination (general) (routine) with abnormal findings: Secondary | ICD-10-CM

## 2017-02-06 NOTE — Progress Notes (Signed)
    Jody Lucas Dec 26, 1955 [id number]        61 y.o.  G3P3003 for annual gynecologic exam.  No gynecologic complaints.  Past medical history,surgical history, problem list, medications, allergies, family history and social history were all reviewed and documented as reviewed in the EPIC chart.  ROS:  Performed with pertinent positives and negatives included in the history, assessment and plan.   Additional significant findings : None   Exam: [name] assistant Vitals:   02/06/17 1429  BP: 118/76  Weight: 151 lb (68.5 kg)  Height: 5\' 2"  (1.575 m)   Body mass index is 27.62 kg/m.  General appearance:  Normal affect, orientation and appearance. Skin: Grossly normal HEENT: Without gross lesions.  No cervical or supraclavicular adenopathy. Thyroid normal.  Lungs:  Clear without wheezing, rales or rhonchi Cardiac: RR, without RMG Abdominal:  Soft, nontender, without masses, guarding, rebound, organomegaly or hernia Breasts:  Examined lying and sitting without masses, retractions, discharge or axillary adenopathy. Pelvic:  Ext, BUS, Vagina: With atrophic changes  Adnexa: Without masses or tenderness    Anus and perineum: Normal   Rectovaginal: Normal sphincter tone without palpated masses or tenderness.    Assessment/Plan:  61 y.o. G23P3003 female for annual gynecologic exam.  That is post [place] with HGSIL  1. Postmenopausal/atrophic genital changes.  No significant hot flushes, night sweats or vaginal dryness. 2. History of 3 cm left ovarian persistent cyst with normal CA 125's.  Followed for years with ultrasound stable.  Had been having some lower abdominal discomfort last year with ultrasound demonstrating stability.  Patient notes this past year she has had no pain.  Exam is normal.  We will continue with expectant management. 3. Mammography 04/2016.  Continue with annual mammography when due.  Breast exam normal today. 4. Pap smear 2017.  No Pap smear done today.  No  history of HGSIL with vaginal hysterectomy 1988.  Normal Pap smears since.  Will plan repeat Pap smear at 3-year interval. 5. Colonoscopy 2014.  Repeat at their recommended interval. 6. DEXA 2016.  Patient followed by Dr. Joylene Draft.  She will continue to follow-up with him in reference to bone health. 7. Health maintenance.  No routine lab work done as this is done at Dr. Silvestre Mesi office.  Follow-up 1 year, sooner as needed.   Anastasio Auerbach MD, 2:55 PM 02/06/2017

## 2017-02-06 NOTE — Patient Instructions (Signed)
Follow-up in 1 year for annual exam, sooner if any issues. 

## 2017-03-20 ENCOUNTER — Ambulatory Visit: Payer: 59 | Admitting: Gynecology

## 2017-03-20 ENCOUNTER — Encounter: Payer: Self-pay | Admitting: Gynecology

## 2017-03-20 VITALS — BP 130/80

## 2017-03-20 DIAGNOSIS — N644 Mastodynia: Secondary | ICD-10-CM

## 2017-03-20 NOTE — Patient Instructions (Signed)
[facility] should call you to schedule the mammogram in several weeks.

## 2017-03-20 NOTE — Progress Notes (Signed)
    Jody Lucas February 08, 1956 [id number]        62 y.o.  G3P3003 presents complaining of bilateral breast tenderness.  Patient has history of TVH for high-grade dysplasia number of years ago.  Had been on estrogen replacement several years ago but discontinued herself and had done well without hot flushes, sweats or vaginal dryness.  Saw Dr. Joylene Draft where bone density showed osteoporosis and was started on estrogen as a treatment for osteoporosis.  She has a history of esophageal issues and she was told that oral bisphosphonates were probably not a good idea.  She has been on this since approximately April and did well until the past 2 weeks or so when she started developing bilateral breast discomfort in the tail of [place] region which worsened and now having a lot of nipple pain bilaterally.  She saw Earle Gell FNP at Dr. Silvestre Mesi office who discussed various possibilities with her and scheduled a diagnostic mammogram and ultrasound which is to be performed tomorrow and started her on doxycycline for possible mastitis.  Interestingly around the time that this all started the patient had transferred her estradiol prescription to a different pharmacy and received a different prescription from what she normally took.  She was taking 1 mg.  She stopped her estrogen 4 days ago and notes that her breast tenderness is actually getting better.  Past medical history,surgical history, problem list, medications, allergies, family history and social history were all reviewed and documented in the EPIC chart.  Directed ROS with pertinent positives and negatives documented in the history of present illness/assessment and plan.  Exam: [name] assistant Vitals:   03/20/17 1543  BP: 130/80   General appearance:  Normal Both breasts examined lying and sitting without masses, retractions, discharge, adenopathy.  Assessment/Plan:  62 y.o. G3P3003 with history as above.  I think that her tenderness is probably  estrogen related, possibly due to a new formulation when she had it filled at a different pharmacy.  Do not feel mastitis is a cause as it would be extremely unusual in a postmenopausal woman occurring bilaterally.  Will check prolactin level given her reports gorged although no nipple discharge was reported or noted on physical exam.  I also reviewed with her that estrogen generally is not considered a primary treatment for osteoporosis and that there are other alternatives besides oral bisphosphonates if there is concern about her esophagus such as using Reclast or Prolia.  I have encouraged her to further discuss this with Dr. Joylene Draft but at this point would hold on using estrogen as I think is the source of her breast tenderness.  I also reviewed with her the [facility] study which showed estrogen alone having a lower risk of breast cancer compared to placebo and that my concern for breast cancer given the bilaterality of her complaints and now getting better after starting estrogen is low.  I did recommend postponing her mammogram scheduled tomorrow to allow her a longer estrogen free interval and to go ahead and reschedule this several weeks from now as a diagnostic bilateral mammogram.  Patient agrees with this and will follow-up for the studies.  She will follow-up with me if her breast tenderness persists at all.    Anastasio Auerbach MD, 4:20 PM 03/20/2017

## 2017-03-21 LAB — PROLACTIN: PROLACTIN: 6.9 ng/mL

## 2017-03-22 ENCOUNTER — Telehealth: Payer: Self-pay | Admitting: *Deleted

## 2017-03-22 NOTE — Telephone Encounter (Signed)
Pt scheduled on 05/10/17 @ 8:15am left message for pt to call.

## 2017-03-22 NOTE — Telephone Encounter (Signed)
Error below, pt scheduled on 05/04/17 @ 8:15am

## 2017-03-22 NOTE — Telephone Encounter (Signed)
-----   Message from Anastasio Auerbach, MD sent at 03/20/2017  4:52 PM EST ----- Schedule bilateral diagnostic mammography in 2-4 weeks at [facility] reference new onset bilateral breast pain following changes in hormone replacement.  Hormone replacement stopped.  Physician exam without masses

## 2017-03-23 NOTE — Telephone Encounter (Signed)
Left detailed message on cell per pt request.

## 2018-02-14 ENCOUNTER — Ambulatory Visit: Payer: 59 | Admitting: Gynecology

## 2018-02-14 ENCOUNTER — Encounter: Payer: Self-pay | Admitting: Gynecology

## 2018-02-14 VITALS — BP 118/80 | Ht 62.0 in | Wt 148.0 lb

## 2018-02-14 DIAGNOSIS — N952 Postmenopausal atrophic vaginitis: Secondary | ICD-10-CM | POA: Diagnosis not present

## 2018-02-14 DIAGNOSIS — Z01411 Encounter for gynecological examination (general) (routine) with abnormal findings: Secondary | ICD-10-CM

## 2018-02-14 DIAGNOSIS — N644 Mastodynia: Secondary | ICD-10-CM | POA: Diagnosis not present

## 2018-02-14 DIAGNOSIS — M818 Other osteoporosis without current pathological fracture: Secondary | ICD-10-CM

## 2018-02-14 NOTE — Patient Instructions (Signed)
Follow-up in 1 year, sooner as needed. 

## 2018-02-14 NOTE — Progress Notes (Signed)
    Jody Lucas 1955-07-18 [id number]        62 y.o.  G3P3003 for annual gynecologic exam.  History of bilateral mastalgia last year.  Seems to correlate when she was changing estrogen regimens through her primary provider.  She subsequently has stopped her estrogen and has done well with no significant menopausal symptoms such as hot flushes or sweats.  She notes that her breast tenderness has significantly improved and she is having some mild intermittent bilateral breast tenderness particularly behind the nipples but it is not consistent and much better than it was.  She had a negative prolactin and mammogram last year.  Past medical history,surgical history, problem list, medications, allergies, family history and social history were all reviewed and documented as reviewed in the EPIC chart.  ROS:  Performed with pertinent positives and negatives included in the history, assessment and plan.   Additional significant findings : None   Exam: Wandra Scot assistant Vitals:   02/14/18 1524  BP: 118/80  Weight: 148 lb (67.1 kg)  Height: 5\' 2"  (1.575 m)   Body mass index is 27.07 kg/m.  General appearance:  Normal affect, orientation and appearance. Skin: Grossly normal HEENT: Without gross lesions.  No cervical or supraclavicular adenopathy. Thyroid normal.  Lungs:  Clear without wheezing, rales or rhonchi Cardiac: RR, without RMG Abdominal:  Soft, nontender, without masses, guarding, rebound, organomegaly or hernia Breasts:  Examined lying and sitting without masses, retractions, discharge or axillary adenopathy. Pelvic:  Ext, BUS, Vagina: Normal with atrophic changes.  Pap smear of vaginal cuff done  Adnexa: Without masses or tenderness    Anus and perineum: Normal   Rectovaginal: Normal sphincter tone without palpated masses or tenderness.    Assessment/Plan:  62 y.o. G59P3003 female for annual gynecologic exam.   1. Postmenopausal/atrophic genital changes.  Status post [place] with HGSIL.  No significant menopausal symptoms off ERT.  We will continue to follow. 2. History of 3 cm left ovarian persistent cyst with normal CA 125's.  Had been stable over years observation.  We both elected to stop screening with ultrasounds and she remains comfortable with this.  She is having no pelvic pain.  Her exam is normal. 3. Breast tenderness much improved from last year.  Exam is normal.  Coming due for mammogram which she will schedule.  Assuming mammogram negative and self breast exams on a monthly basis negative then will follow expectantly.  If persistent pain or any palpable abnormality she will follow-up for reevaluation. 4. Colonoscopy 2014.  Repeat at their recommended interval. 5. Pap smear 2017.  Pap smear of vaginal cuff done today given history of HGSIL. 6. Osteoporosis.  Being followed by Dr. Joylene Draft.  Continue to follow-up with him in reference to bone health. 7. Health maintenance.  No routine lab work done as patient reports this done at Dr. Silvestre Mesi office.  Follow-up 1 year, sooner as needed.   Anastasio Auerbach MD, 3:59 PM 02/14/2018

## 2018-02-19 LAB — PAP IG W/ RFLX HPV ASCU

## 2018-02-21 DIAGNOSIS — H9319 Tinnitus, unspecified ear: Secondary | ICD-10-CM | POA: Insufficient documentation

## 2018-03-18 DIAGNOSIS — M180 Bilateral primary osteoarthritis of first carpometacarpal joints: Secondary | ICD-10-CM | POA: Insufficient documentation

## 2018-03-18 DIAGNOSIS — M65341 Trigger finger, right ring finger: Secondary | ICD-10-CM | POA: Insufficient documentation

## 2018-04-25 ENCOUNTER — Encounter: Payer: Self-pay | Admitting: Gynecology

## 2018-05-14 DIAGNOSIS — R011 Cardiac murmur, unspecified: Secondary | ICD-10-CM | POA: Insufficient documentation

## 2018-05-20 ENCOUNTER — Other Ambulatory Visit: Payer: Self-pay | Admitting: Internal Medicine

## 2018-05-20 DIAGNOSIS — E785 Hyperlipidemia, unspecified: Secondary | ICD-10-CM

## 2018-05-27 ENCOUNTER — Ambulatory Visit
Admission: RE | Admit: 2018-05-27 | Discharge: 2018-05-27 | Disposition: A | Discharge status: Home / Self Care | Payer: Self-pay | Source: Ambulatory Visit | Attending: Internal Medicine | Admitting: Internal Medicine

## 2018-05-27 DIAGNOSIS — E785 Hyperlipidemia, unspecified: Secondary | ICD-10-CM

## 2018-05-28 DIAGNOSIS — R918 Other nonspecific abnormal finding of lung field: Secondary | ICD-10-CM | POA: Insufficient documentation

## 2018-12-10 ENCOUNTER — Encounter: Payer: Self-pay | Admitting: Gynecology

## 2018-12-12 DIAGNOSIS — Z9889 Other specified postprocedural states: Secondary | ICD-10-CM

## 2018-12-12 DIAGNOSIS — Z85828 Personal history of other malignant neoplasm of skin: Secondary | ICD-10-CM

## 2018-12-12 HISTORY — DX: Personal history of other malignant neoplasm of skin: Z85.828

## 2018-12-12 HISTORY — PX: MOHS SURGERY: SUR867

## 2018-12-12 HISTORY — DX: Other specified postprocedural states: Z98.890

## 2019-02-20 ENCOUNTER — Other Ambulatory Visit: Payer: Self-pay

## 2019-02-21 ENCOUNTER — Encounter: Payer: Self-pay | Admitting: Gynecology

## 2019-02-21 ENCOUNTER — Ambulatory Visit (INDEPENDENT_AMBULATORY_CARE_PROVIDER_SITE_OTHER): Payer: 59 | Admitting: Gynecology

## 2019-02-21 VITALS — BP 124/80 | Ht 62.0 in | Wt 143.0 lb

## 2019-02-21 DIAGNOSIS — N952 Postmenopausal atrophic vaginitis: Secondary | ICD-10-CM

## 2019-02-21 DIAGNOSIS — M810 Age-related osteoporosis without current pathological fracture: Secondary | ICD-10-CM

## 2019-02-21 DIAGNOSIS — Z01419 Encounter for gynecological examination (general) (routine) without abnormal findings: Secondary | ICD-10-CM | POA: Diagnosis not present

## 2019-02-21 NOTE — Patient Instructions (Signed)
Follow-up in 1 year, sooner as needed. 

## 2019-02-21 NOTE — Progress Notes (Signed)
    Jody Lucas July 07, 1955 [id number]        63 y.o.  G3P3003 for annual gynecologic exam.  Without gynecologic complaints  Past medical history,surgical history, problem list, medications, allergies, family history and social history were all reviewed and documented as reviewed in the EPIC chart.  ROS:  Performed with pertinent positives and negatives included in the history, assessment and plan.   Additional significant findings : None   Exam: Jody Lucas assistant Vitals:   02/21/19 0941  BP: 124/80  Weight: 143 lb (64.9 kg)  Height: 5\' 2"  (1.575 m)   Body mass index is 26.16 kg/m.  General appearance:  Normal affect, orientation and appearance. Skin: Grossly normal HEENT: Without gross lesions.  No cervical or supraclavicular adenopathy. Thyroid normal.  Lungs:  Clear without wheezing, rales or rhonchi Cardiac: RR, without RMG Abdominal:  Soft, nontender, without masses, guarding, rebound, organomegaly or hernia Breasts:  Examined lying and sitting without masses, retractions, discharge or axillary adenopathy. Pelvic:  Ext, BUS, Vagina: Normal with atrophic changes  Adnexa: Without masses or tenderness    Anus and perineum: Normal   Rectovaginal: Normal sphincter tone without palpated masses or tenderness.    Assessment/Plan:  63 y.o. G76P3003 female for annual gynecologic exam.  Status post [place] for HGSIL  1. Postmenopausal.  No significant menopausal symptoms. 2. History of 3 cm left ovarian persistent cyst with normal CA-125's.  Have been stable over years observation.  We both agree to stop screening.  Exam is normal today. 3. Pap smear 2019.  No Pap smear done today.  History of HGSIL 1988 4. Mammography 04/2018.  Continue with annual mammography when due.  Breast exam normal today. 5. Osteoporosis.  Reports DEXA 2019.  Followed by Dr. Joylene Draft. 6. Colonoscopy 2014.  Repeat at their recommended interval. 7. Health maintenance.  No routine lab work done as  patient does this elsewhere.  Follow-up 1 year, sooner as needed.   Jody Auerbach MD, 10:08 AM 02/21/2019

## 2019-04-13 ENCOUNTER — Ambulatory Visit: Payer: Self-pay

## 2019-04-20 ENCOUNTER — Ambulatory Visit: Payer: 59 | Attending: Internal Medicine

## 2019-04-20 DIAGNOSIS — Z23 Encounter for immunization: Secondary | ICD-10-CM

## 2019-04-20 NOTE — Progress Notes (Signed)
   Covid-19 Vaccination Clinic  Name:  Jody Lucas    MRN: [id number] DOB: 02-16-1956  04/20/2019  [name] was observed post Covid-19 immunization for 15 minutes without incidence. She was provided with Vaccine Information Sheet and instruction to access the V-Safe system.   [name] was instructed to call 911 with any severe reactions post vaccine: Marland Kitchen Difficulty breathing  . Swelling of your face and throat  . A fast heartbeat  . A bad rash all over your body  . Dizziness and weakness    Immunizations Administered    Name Date Dose VIS Date Route   Pfizer COVID-19 Vaccine 04/20/2019  8:32 AM 0.3 mL 02/21/2019 Intramuscular   Manufacturer: [place]   Lot: [id number]   [place]: [id number]

## 2019-05-13 ENCOUNTER — Ambulatory Visit: Payer: 59 | Attending: Internal Medicine

## 2019-05-13 DIAGNOSIS — Z23 Encounter for immunization: Secondary | ICD-10-CM | POA: Insufficient documentation

## 2019-05-13 NOTE — Progress Notes (Signed)
   Covid-19 Vaccination Clinic  Name:  Jody Lucas    MRN: [id number] DOB: [birth year]  05/13/2019  [name] was observed post Covid-19 immunization for 15 minutes without incident. She was provided with Vaccine Information Sheet and instruction to access the V-Safe system.   [name] was instructed to call 911 with any severe reactions post vaccine: Marland Kitchen Difficulty breathing  . Swelling of face and throat  . A fast heartbeat  . A bad rash all over body  . Dizziness and weakness   Immunizations Administered    Name Date Dose VIS Date Route   Pfizer COVID-19 Vaccine 05/13/2019 10:29 AM 0.3 mL 02/21/2019 Intramuscular   Manufacturer: [place]   Lot: [id number]   [place]: [id number]

## 2019-10-18 IMAGING — CT CT HEART SCORING
3 series · 13 of 20 positions shown, 15 images · non-contrast
Comparison: No priors.

CLINICAL DATA: 62-year-old female with elevated cholesterol and
family history of heart disease.

EXAM:
CT HEART FOR CALCIUM SCORING
TECHNIQUE: CT heart was performed using prospective ECG gating.
A non-contrast exam for calcium scoring was performed.
Note that this exam targets the heart and the chest was not imaged
in its entirety.

[Series 2: calcium scoring 2.00 qr36 bestdiast 69% · axial · 0.37mm/px · z∈[+1353,+1417]mm · 3 of 80 slices shown]
[im 16/80  vessel]
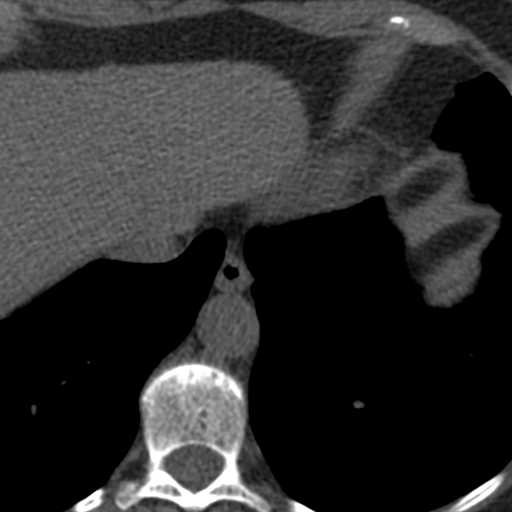
[im 32/80  vessel]
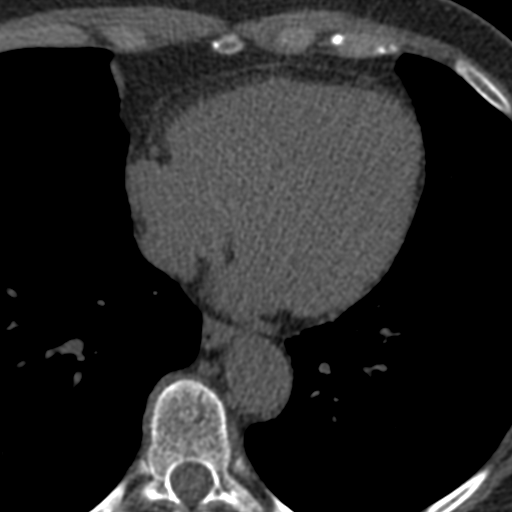
[im 48/80  vessel]
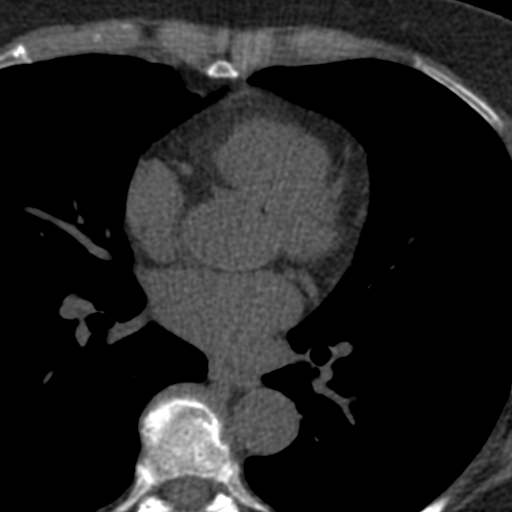

[Series 3: calcium scoring 2.00 br40 bestdiast 69% ax fov · axial · 0.55mm/px · z∈[+1349,+1453]mm · 5 of 80 slices shown, 7 images]
[im 14/80  vessel]
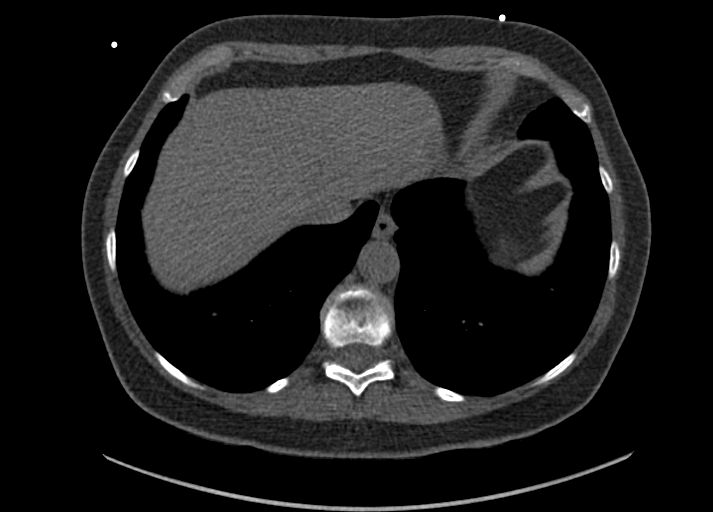
[im 14/80  lung]
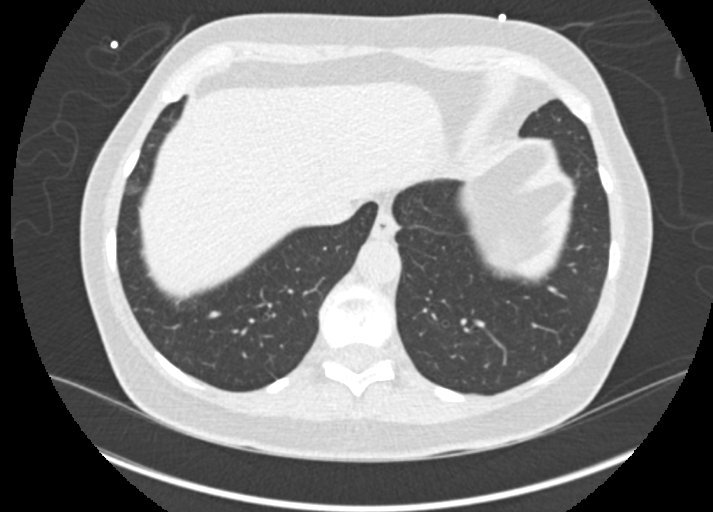
[im 27/80  vessel]
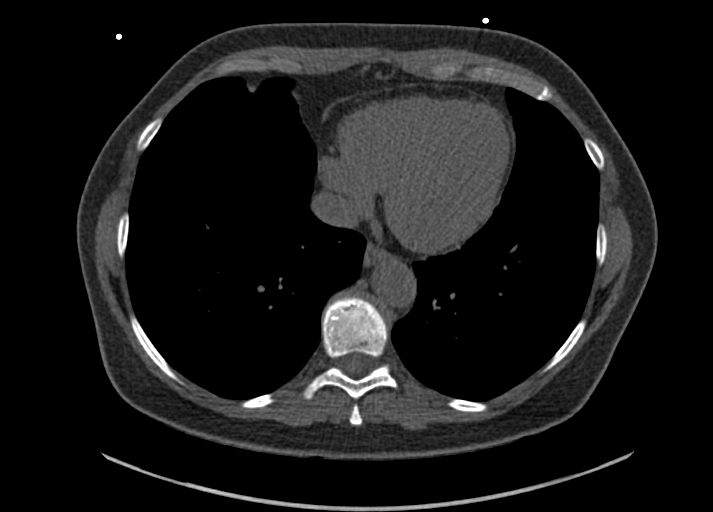
[im 40/80  vessel]
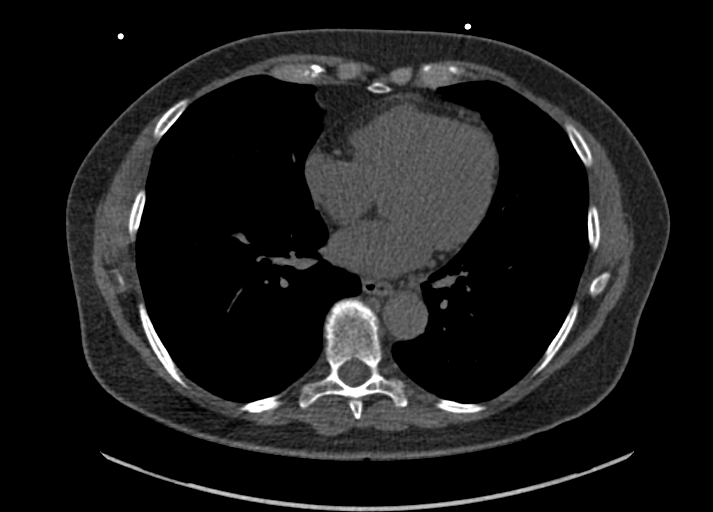
[im 53/80  vessel]
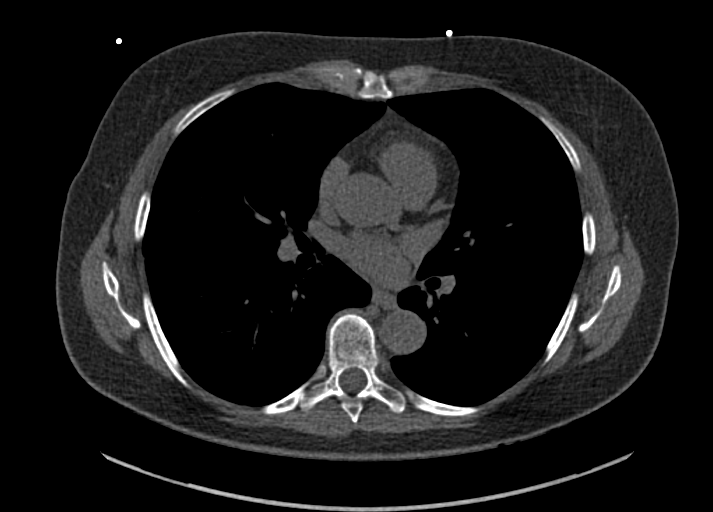
[im 66/80  vessel]
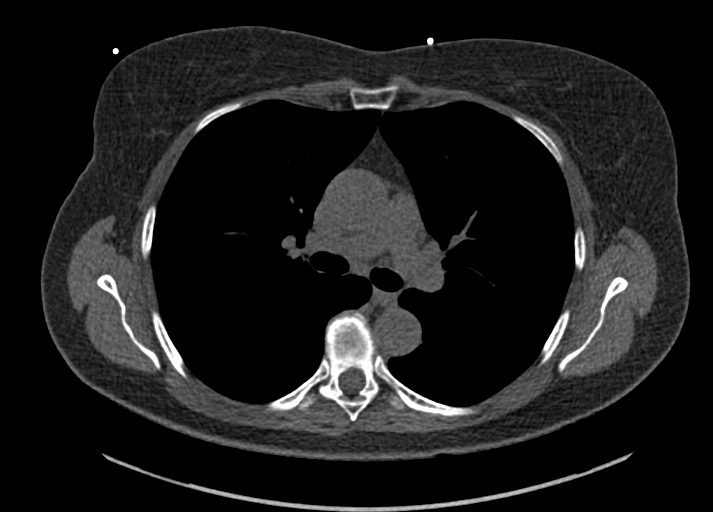
[im 66/80  lung]
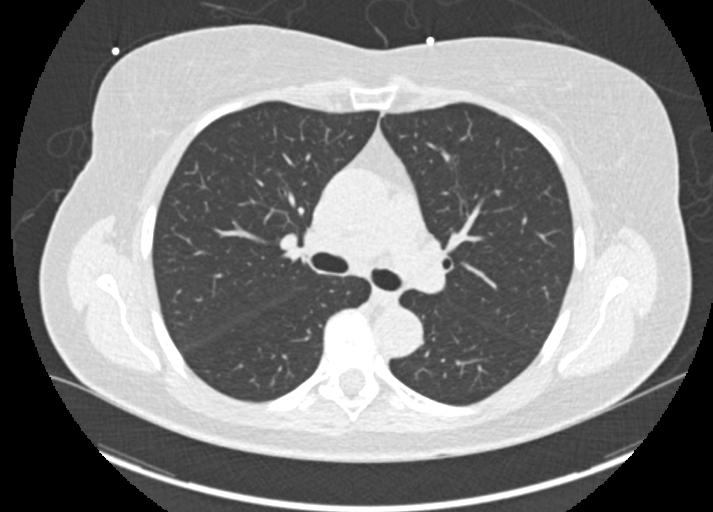

[Series 9: calcium scoring 2.00 br60 bestdiast 69% ax fov · axial · 0.53mm/px · z∈[+1349,+1453]mm · 5 of 80 slices shown]
[im 14/80  vessel]
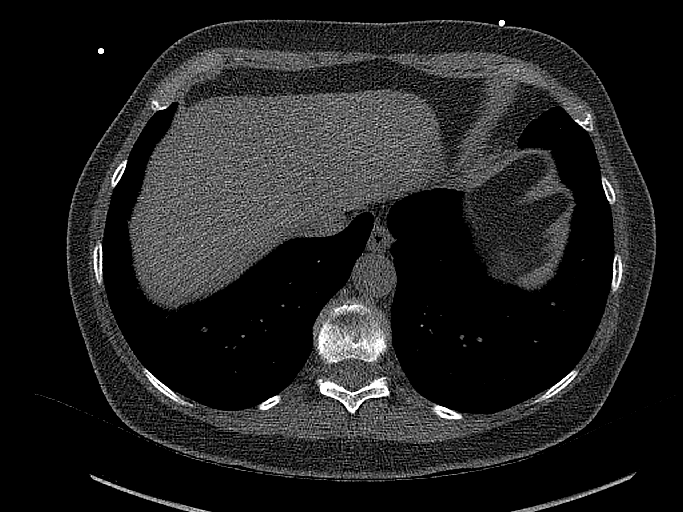
[im 27/80  vessel]
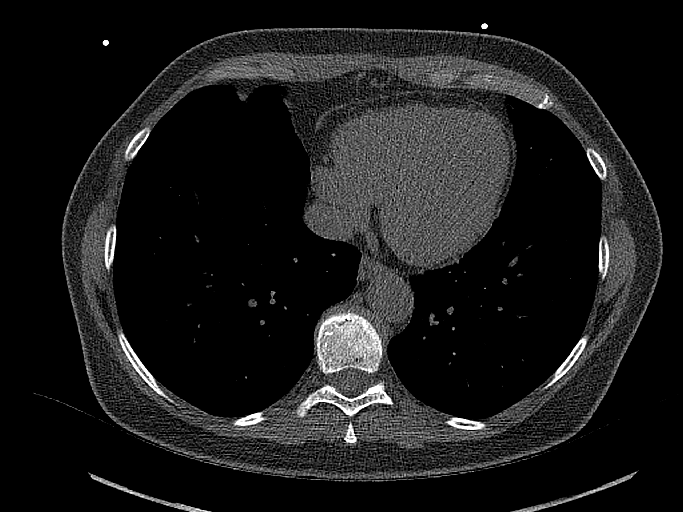
[im 40/80  vessel]
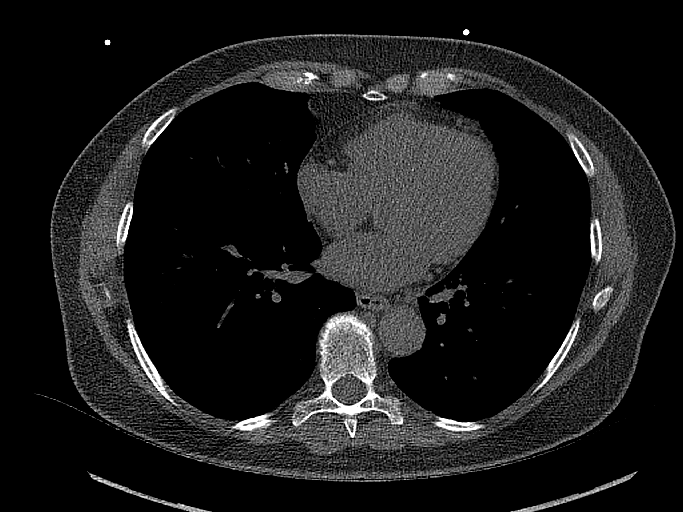
[im 53/80  vessel]
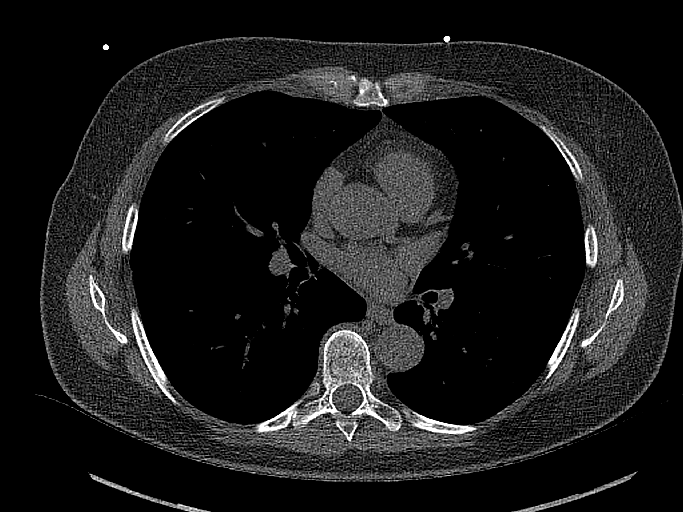
[im 66/80  vessel]
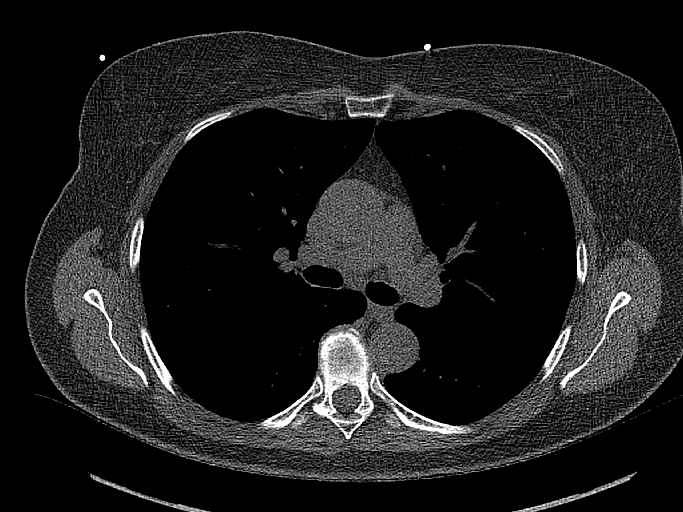

[13 of 20 positions shown; findings below may reference images not displayed]

FINDINGS: Technical quality: Good.

CORONARY CALCIUM

Total Agatston Score: 0

[HOSPITAL] percentile:  N/A

OTHER FINDINGS:

4 mm subpleural nodule in left lower lobe (axial image 62 of series
9), nonspecific, but statistically likely to represent a benign
subpleural lymph node. Within the visualized portions of the thorax
there are no other larger more suspicious appearing pulmonary
nodules or masses, there is no acute consolidative airspace disease,
no pleural effusions, no pneumothorax and no lymphadenopathy.
Visualized portions of the upper abdomen are unremarkable. There are
no aggressive appearing lytic or blastic lesions noted in the
visualized portions of the skeleton.
IMPRESSION: 1. Patient's total coronary artery calcium score is 0 which
indicates a very low (but non-zero) risk of major adverse
cardiovascular events over the next 10 years.
2. 4 mm subpleural nodule in the periphery of the left lower lobe,
nonspecific but statistically likely benign. No follow-up needed if
patient is low-risk. Non-contrast chest CT can be considered in 12
months if patient is high-risk. This recommendation follows the
consensus statement: Guidelines for Management of Incidental
Pulmonary Nodules Detected on CT Images: From the [HOSPITAL]

## 2019-12-12 DIAGNOSIS — N83209 Unspecified ovarian cyst, unspecified side: Secondary | ICD-10-CM

## 2019-12-12 HISTORY — DX: Unspecified ovarian cyst, unspecified side: N83.209

## 2019-12-30 ENCOUNTER — Other Ambulatory Visit: Payer: Self-pay

## 2019-12-30 ENCOUNTER — Ambulatory Visit: Payer: 59 | Admitting: Obstetrics and Gynecology

## 2019-12-30 ENCOUNTER — Encounter: Payer: Self-pay | Admitting: Obstetrics and Gynecology

## 2019-12-30 VITALS — BP 118/74

## 2019-12-30 DIAGNOSIS — N83202 Unspecified ovarian cyst, left side: Secondary | ICD-10-CM

## 2019-12-30 DIAGNOSIS — R1031 Right lower quadrant pain: Secondary | ICD-10-CM | POA: Diagnosis not present

## 2019-12-30 DIAGNOSIS — N83201 Unspecified ovarian cyst, right side: Secondary | ICD-10-CM | POA: Diagnosis not present

## 2019-12-30 NOTE — Progress Notes (Signed)
Jody Lucas 20-Feb-1956 [id number]  SUBJECTIVE:  64 y.o. G3P3003 female presents for evaluation of a right lower quadrant abdominal pain of 1 week duration.  Last Tuesday she was feeling fine until later in the day she started developing aching pain in the right lower quadrant which quickly progressed to severe sharp and stabbing pains intermittently.  She points to the area right in the flexion point of her hip joint just lateral to her inguinal ligament where the pain is.  She also has radiation of the pain over her flank.  He has been taking naproxen and Tylenol with some improvement in the pain in the last week but it is still painful and aching on that side with occasional sharp zings of pain causing her significant discomfort.  She went to the emergency room at [facility] on 12/26/2019 and her work-up included a CT of the abdomen and pelvis which demonstrated a stable left adnexal cyst measuring 3.3 cm.  Also noted to be constipated but no acute findings.  This left cyst was known about and has been followed previously in our office.  She had a pelvic ultrasound which indicated the left ovarian cyst which was labeled as anechoic and simple.  The right ovarian cyst measures 1.3 x 1.0 x 0.7 cm and appeared hemorrhagic.  There is no free fluid in the pelvis.  Patient indicates that the pelvic ultrasound was repeated serially as initially there did not appear to be blood flow by Doppler study to the ovary but after waiting a period of time there appeared to be normal blood flow.  The ED provider note indicates that she was given an enema with a resultant extensive bowel movement and repeated ultrasound after that indicated good venous flow to both ovaries to lessen the likelihood of ovarian torsion.   She was treated with muscle relaxant and anti-inflammatory medications which she has since stopped because they made her very sleepy. 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   She had significant hyponatremia and was also treated for a UTI and is working on finishing her course of antibiotics.  She tells me she has improved her constipation and has been having daily bowel movements but continues to have this ongoing RLQ pain.   Current Outpatient Medications  Medication Sig Dispense Refill  . CALCIUM PO Take 1,000 mg by mouth daily.    . cephALEXin (KEFLEX) 500 MG capsule Take 500 mg by mouth 4 (four) times daily.    . Cholecalciferol (VITAMIN D PO) Take 1,600 Units by mouth daily.    . Multiple Vitamin (MULTIVITAMIN) tablet Take 1 tablet by mouth daily.    . naproxen (NAPROSYN) 500 MG tablet Take 500 mg by mouth 2 (two) times daily with a meal.    . omeprazole (PRILOSEC) 20 MG capsule TAKE (1) CAPSULE DAILY. 30 capsule 6  . UNKNOWN TO PATIENT BP medication -Pt. Can't remember name     No current facility-administered medications for this visit.   Allergies: Codeine  No LMP recorded. Patient has had a hysterectomy.  Past medical history,surgical history, problem list, medications, allergies, family history and social history were all reviewed and documented as reviewed in the EPIC chart.  ROS: Pertinent positives and negatives as reviewed in HPI.    OBJECTIVE:  BP 118/74  The patient appears well, alert, oriented, in no distress. BACK: Tenderness over right sacroiliac joint, no flank pain, no CVA tenderness ABDOMEN: Soft, nontender, nondistended PELVIC EXAM: Bimanual exam performed  UTERUS: surgically absent, ADNEXA: tenderness right, mass present right side, size 3 cm, no rebound but voluntary guarding present with palpation of the right lower pelvic area.  Palpation over the inguinal ligament elicits no tenderness.  Palpation prior to the bimanual exam over the flexion point of the right hip joint seemed to be where her tenderness was concentrated, however, upon performing the bimanual examination her pain seemed to be more intrapelvic.  Light touch sensation  over the mons pubis and pelvic areas is bilaterally normal. Chaperone: [name] present during the examination  ASSESSMENT:  64 y.o. [phone] with right lower quadrant pain and possible intermittent right ovarian torsion  PLAN:  We reviewed the pelvic imaging.  She has of a stable left ovarian cyst.  Her pain is contralateral on the right side and the cyst in the right ovary is very small, however, ovarian cyst can predispose to ovarian torsion and her sudden onset pain last week and ongoing pain medicine requirements suggest that she could be undergoing intermittent ovarian torsion on that right side.  The physical examination seemed to suggest this the most likely etiology.  I do not think her degenerative back problems are causing pain in the right pelvic area.  The muscle relaxants that she took did not really help the pain so it seems to be less likely muscular/ligament.  On the examination there is palpation of a knot in the pelvic area in the vicinity of where the right ovary would be that seems to cause her excruciating pain when pressed.  I wonder if she has a intermittent ovarian torsion or necrotic ovary.  Normal WBC count noted on her labs in ED.   I proposed a diagnostic laparoscopy to provide [place] with more information.  I suggested performing a bilateral salpingo-oophorectomy if we are going to be proceeding with laparoscopy, especially since she has the known left ovarian cyst and now this new right abdominal pain as well with exam findings that suggest adnexal pathology.  We discussed that if we proceed with this route, and her pain is actually due to something else unrelated, her pain may not improve after having the surgery, and she is okay with this.  We also discussed the possibility of pelvic adhesions from prior surgery and ovarian remnants if part of one or both ovaries is left behind.  Also the possibility of mild but hopefully transient hot flashes is possible with bilateral  oophorectomy in early to mid menopause. This surgery would be a same-day outpatient procedure.  Postoperative recovery expectations of needing at least 1 to 2 weeks of downtime with limited activity are reviewed.  Postoperative lifting restriction less than 20 pounds for 4-6 weeks.  With surgery, there are risks of infection, bleeding, possibility of needing a blood transfusion, injury to bowel, bladder, major pelvic vessels, ureter, nerves from positioning and or skin irritation, and deep venous thrombosis. The risk of inadvertent injury to internal organs either immediately recognized or delay recognized necessitating major exploratory reparative surgeries and future reparative surgeries including bowel resection, ostomy formation, bladder repair, ureteral damage repair was discussed with her.  General anesthesia also has risks including myocardial infarction, stroke, and death.  Common scenarios for complications from surgery were reviewed with the patient including focus on bladder and/or ureteral injuries.  Incisional complications to include opening and draining of incisions and closure by secondary intention, dehiscence, and hernia formation were reviewed. Generally the complication rate is less than 1%.  Conversion to laparotomy may be deemed necessary  at the time of the procedure, which if performed would result in longer hospital stay, and more postoperative pain and healing time.  The patient had lab work drawn with her primary doctor today so before proceeding I would want to make sure that she is medically cleared for surgery and her hyponatremia has improved.  Also I would want her to have finished her antibiotic treatment of acute cystitis before proceeding.  I will have staff contact her to look for possible surgery dates.   Joseph Pierini MD 12/30/19

## 2019-12-30 NOTE — Progress Notes (Signed)
Entered in error

## 2019-12-31 ENCOUNTER — Telehealth: Payer: Self-pay

## 2019-12-31 NOTE — Telephone Encounter (Signed)
I called patient to let her know that Dr. Raliegh Ip wants her to get medical clearance from Dr. Joylene Draft before surgery and to make sure the hyponatremia has resolved. I left a message in Dr. Silvestre Mesi assistant's voice mail before lunch and I have not yet heard from her. I have not received any lab results as patient thought they had been faxed over. I did leave a message letting their office know patient is in pain, awaiting [place] scheduling surgery for her.

## 2020-01-01 ENCOUNTER — Telehealth: Payer: Self-pay

## 2020-01-01 NOTE — Telephone Encounter (Signed)
Patient called. She said Dr. Joylene Draft called her last evening and told her that he thought that it would be fine to go ahead with her surgery at the current sodium level in light of her pain. He thought she should come back to [facility] today and repeat the panel so that Dr. Delilah Shan would have current result.  She said Dr. Joylene Draft said that you are welcome to call him anytime today and he will try to get right to the phone to talk with you.  Dr. Silvestre Mesi # [phone].  (Today patient expressed that she hoped I could schedule surgery Monday because of the pain. I think with Covid pre-testing that Weds is the soonest I can schedule her now and that will be at 1pm.)  Please advise  #1 if ok to order labs for today and what to order?  #2 If ok at this point to schedule surgery for Weds.?

## 2020-01-01 NOTE — Telephone Encounter (Signed)
Patient was advised of Dr. Scarlette Ar reply.  Surgery scheduled for 01/07/20 at 1pm at [facility] [place]. Covid test scheduled for Saturday and patient advised regarding quarantine protocol after test. Ins benefits and estimated surgery prepayment discussed and patient made payment. I will not mail packet since will likely not receive it before surgery.

## 2020-01-01 NOTE — Telephone Encounter (Signed)
I do not think it is necessary to repeat the sodium level today as she just had this done 2 days ago.  If he is okay with her having surgery with the sodium level, then I think we can just go ahead and check a CBC and BMP prior to her surgery as part of her normal preoperative labs.  If she would like to schedule surgery soon and we can do this next week then I am okay with that.

## 2020-01-02 ENCOUNTER — Encounter (HOSPITAL_BASED_OUTPATIENT_CLINIC_OR_DEPARTMENT_OTHER): Payer: Self-pay | Admitting: Obstetrics and Gynecology

## 2020-01-02 ENCOUNTER — Other Ambulatory Visit: Payer: Self-pay

## 2020-01-02 NOTE — Progress Notes (Signed)
Spoke w/ via phone for pre-op interview--- PT Lab needs dos---  Istat and EKG (pre-op orders pending)               Lab results------ no COVID test ------ 01-03-2020 @ 0910 Arrive at ------- 1100 NPO after MN NO Solid Food.  Clear liquids from MN until--- 1000 Medications to take morning of surgery ----- Prilosec and if needed take tramadol Diabetic medication ----- n/a Patient Special Instructions ----- n/a Pre-Op special Istructions ----- pre-op orders pending  Patient verbalized understanding of instructions that were given at this phone interview. Patient denies shortness of breath, chest pain, fever, cough at this phone interview.

## 2020-01-03 ENCOUNTER — Other Ambulatory Visit (HOSPITAL_COMMUNITY)
Admission: RE | Admit: 2020-01-03 | Discharge: 2020-01-03 | Disposition: A | Discharge status: Home / Self Care | Payer: 59 | Source: Ambulatory Visit | Attending: Obstetrics and Gynecology | Admitting: Obstetrics and Gynecology

## 2020-01-03 DIAGNOSIS — Z01812 Encounter for preprocedural laboratory examination: Secondary | ICD-10-CM | POA: Insufficient documentation

## 2020-01-03 DIAGNOSIS — Z20822 Contact with and (suspected) exposure to covid-19: Secondary | ICD-10-CM | POA: Insufficient documentation

## 2020-01-03 LAB — SARS CORONAVIRUS 2 (TAT 6-24 HRS): SARS Coronavirus 2: NEGATIVE

## 2020-01-06 ENCOUNTER — Encounter: Payer: Self-pay | Admitting: Gynecology

## 2020-01-07 ENCOUNTER — Encounter (HOSPITAL_BASED_OUTPATIENT_CLINIC_OR_DEPARTMENT_OTHER): Payer: Self-pay | Admitting: Obstetrics and Gynecology

## 2020-01-07 ENCOUNTER — Other Ambulatory Visit: Payer: Self-pay

## 2020-01-07 ENCOUNTER — Encounter (HOSPITAL_BASED_OUTPATIENT_CLINIC_OR_DEPARTMENT_OTHER)
Admission: RE | Disposition: A | Discharge status: Home / Self Care | Payer: Self-pay | Source: Home / Self Care | Attending: Obstetrics and Gynecology

## 2020-01-07 ENCOUNTER — Ambulatory Visit (HOSPITAL_BASED_OUTPATIENT_CLINIC_OR_DEPARTMENT_OTHER)
Admission: RE | Admit: 2020-01-07 | Discharge: 2020-01-07 | Disposition: A | Discharge status: Home / Self Care | Payer: 59 | Attending: Obstetrics and Gynecology | Admitting: Obstetrics and Gynecology

## 2020-01-07 ENCOUNTER — Ambulatory Visit (HOSPITAL_BASED_OUTPATIENT_CLINIC_OR_DEPARTMENT_OTHER): Payer: 59 | Admitting: Anesthesiology

## 2020-01-07 DIAGNOSIS — M810 Age-related osteoporosis without current pathological fracture: Secondary | ICD-10-CM | POA: Diagnosis not present

## 2020-01-07 DIAGNOSIS — R1031 Right lower quadrant pain: Secondary | ICD-10-CM | POA: Diagnosis present

## 2020-01-07 DIAGNOSIS — N736 Female pelvic peritoneal adhesions (postinfective): Secondary | ICD-10-CM | POA: Insufficient documentation

## 2020-01-07 DIAGNOSIS — K219 Gastro-esophageal reflux disease without esophagitis: Secondary | ICD-10-CM | POA: Diagnosis not present

## 2020-01-07 DIAGNOSIS — Z79899 Other long term (current) drug therapy: Secondary | ICD-10-CM | POA: Insufficient documentation

## 2020-01-07 DIAGNOSIS — N83202 Unspecified ovarian cyst, left side: Secondary | ICD-10-CM | POA: Insufficient documentation

## 2020-01-07 DIAGNOSIS — Z90710 Acquired absence of both cervix and uterus: Secondary | ICD-10-CM | POA: Insufficient documentation

## 2020-01-07 DIAGNOSIS — Z85828 Personal history of other malignant neoplasm of skin: Secondary | ICD-10-CM | POA: Diagnosis not present

## 2020-01-07 DIAGNOSIS — Z791 Long term (current) use of non-steroidal anti-inflammatories (NSAID): Secondary | ICD-10-CM | POA: Insufficient documentation

## 2020-01-07 DIAGNOSIS — Z885 Allergy status to narcotic agent status: Secondary | ICD-10-CM | POA: Insufficient documentation

## 2020-01-07 DIAGNOSIS — I10 Essential (primary) hypertension: Secondary | ICD-10-CM | POA: Diagnosis not present

## 2020-01-07 DIAGNOSIS — M1990 Unspecified osteoarthritis, unspecified site: Secondary | ICD-10-CM | POA: Insufficient documentation

## 2020-01-07 DIAGNOSIS — N83201 Unspecified ovarian cyst, right side: Secondary | ICD-10-CM | POA: Insufficient documentation

## 2020-01-07 DIAGNOSIS — E785 Hyperlipidemia, unspecified: Secondary | ICD-10-CM | POA: Diagnosis not present

## 2020-01-07 HISTORY — DX: Cardiac murmur, unspecified: R01.1

## 2020-01-07 HISTORY — DX: Other specified postprocedural states: Z98.890

## 2020-01-07 HISTORY — DX: Personal history of other diseases of the digestive system: Z87.19

## 2020-01-07 HISTORY — DX: Nausea with vomiting, unspecified: R11.2

## 2020-01-07 HISTORY — DX: Gastro-esophageal reflux disease without esophagitis: K21.9

## 2020-01-07 HISTORY — DX: Pelvic and perineal pain: R10.2

## 2020-01-07 HISTORY — DX: Unspecified osteoarthritis, unspecified site: M19.90

## 2020-01-07 HISTORY — PX: LAPAROSCOPIC BILATERAL SALPINGO OOPHERECTOMY: SHX5890

## 2020-01-07 LAB — CBC
HCT: 39 % (ref 36.0–46.0)
Hemoglobin: 13.4 g/dL (ref 12.0–15.0)
MCH: 31.2 pg (ref 26.0–34.0)
MCHC: 34.4 g/dL (ref 30.0–36.0)
MCV: 90.7 fL (ref 80.0–100.0)
Platelets: 276 10*3/uL (ref 150–400)
RBC: 4.3 MIL/uL (ref 3.87–5.11)
RDW: 12.9 % (ref 11.5–15.5)
WBC: 5.8 10*3/uL (ref 4.0–10.5)
nRBC: 0 % (ref 0.0–0.2)

## 2020-01-07 LAB — BASIC METABOLIC PANEL
Anion gap: 10 (ref 5–15)
BUN: 14 mg/dL (ref 8–23)
CO2: 26 mmol/L (ref 22–32)
Calcium: 8.9 mg/dL (ref 8.9–10.3)
Chloride: 99 mmol/L (ref 98–111)
Creatinine, Ser: 0.81 mg/dL (ref 0.44–1.00)
GFR, Estimated: >60 mL/min (ref >60)
Glucose, Bld: 89 mg/dL (ref 70–99)
Potassium: 4.4 mmol/L (ref 3.5–5.1)
Sodium: 135 mmol/L (ref 135–145)

## 2020-01-07 SURGERY — SALPINGO-OOPHORECTOMY, BILATERAL, LAPAROSCOPIC
Anesthesia: General | Laterality: Bilateral

## 2020-01-07 MED ORDER — KETOROLAC TROMETHAMINE 30 MG/ML IJ SOLN
INTRAMUSCULAR | Status: AC
  Filled 2020-01-07: qty 1

## 2020-01-07 MED ORDER — OXYCODONE HCL 5 MG PO TABS: 5.0000 mg | ORAL_TABLET | ORAL | Status: DC | PRN

## 2020-01-07 MED ORDER — FENTANYL CITRATE (PF) 100 MCG/2ML IJ SOLN
INTRAMUSCULAR | Status: AC
  Filled 2020-01-07: qty 2

## 2020-01-07 MED ORDER — SODIUM CHLORIDE 0.9 % IR SOLN
Status: DC | PRN
  Administered 2020-01-07: 3000 mL

## 2020-01-07 MED ORDER — SODIUM CHLORIDE 0.9% FLUSH: 3.0000 mL | Freq: Two times a day (BID) | INTRAVENOUS | Status: DC

## 2020-01-07 MED ORDER — DEXAMETHASONE SODIUM PHOSPHATE 10 MG/ML IJ SOLN
INTRAMUSCULAR | Status: DC | PRN
  Administered 2020-01-07: 8 mg via INTRAVENOUS

## 2020-01-07 MED ORDER — ACETAMINOPHEN 500 MG PO TABS: 1000.0000 mg | ORAL_TABLET | Freq: Four times a day (QID) | ORAL | Status: AC

## 2020-01-07 MED ORDER — LACTATED RINGERS IV SOLN: INTRAVENOUS | Status: DC

## 2020-01-07 MED ORDER — AMISULPRIDE (ANTIEMETIC) 5 MG/2ML IV SOLN: 10.0000 mg | Freq: Once | INTRAVENOUS | Status: DC | PRN

## 2020-01-07 MED ORDER — TRAMADOL HCL 50 MG PO TABS
50.0000 mg | ORAL_TABLET | Freq: Four times a day (QID) | ORAL | Status: DC | PRN
Start: 2020-01-07 — End: 2020-02-25

## 2020-01-07 MED ORDER — DEXAMETHASONE SODIUM PHOSPHATE 10 MG/ML IJ SOLN
INTRAMUSCULAR | Status: AC
  Filled 2020-01-07: qty 1

## 2020-01-07 MED ORDER — MIDAZOLAM HCL 5 MG/5ML IJ SOLN
INTRAMUSCULAR | Status: DC | PRN
  Administered 2020-01-07: 2 mg via INTRAVENOUS

## 2020-01-07 MED ORDER — POVIDONE-IODINE 10 % EX SWAB: 2.0000 "application " | Freq: Once | CUTANEOUS | Status: DC

## 2020-01-07 MED ORDER — KETOROLAC TROMETHAMINE 30 MG/ML IJ SOLN
INTRAMUSCULAR | Status: DC | PRN
  Administered 2020-01-07: 30 mg via INTRAVENOUS

## 2020-01-07 MED ORDER — LIDOCAINE 2% (20 MG/ML) 5 ML SYRINGE
INTRAMUSCULAR | Status: AC
  Filled 2020-01-07: qty 5

## 2020-01-07 MED ORDER — ACETAMINOPHEN 325 MG PO TABS: 650.0000 mg | ORAL_TABLET | ORAL | Status: DC | PRN

## 2020-01-07 MED ORDER — MIDAZOLAM HCL 2 MG/2ML IJ SOLN
INTRAMUSCULAR | Status: AC
  Filled 2020-01-07: qty 2

## 2020-01-07 MED ORDER — ONDANSETRON HCL 4 MG/2ML IJ SOLN
INTRAMUSCULAR | Status: AC
  Filled 2020-01-07: qty 2

## 2020-01-07 MED ORDER — ACETAMINOPHEN 325 MG RE SUPP: 650.0000 mg | RECTAL | Status: DC | PRN

## 2020-01-07 MED ORDER — SODIUM CHLORIDE 0.9 % IV SOLN: 250.0000 mL | INTRAVENOUS | Status: DC | PRN

## 2020-01-07 MED ORDER — SODIUM CHLORIDE 0.9% FLUSH: 3.0000 mL | INTRAVENOUS | Status: DC | PRN

## 2020-01-07 MED ORDER — ROCURONIUM BROMIDE 10 MG/ML (PF) SYRINGE
PREFILLED_SYRINGE | INTRAVENOUS | Status: AC
  Filled 2020-01-07: qty 10

## 2020-01-07 MED ORDER — PROMETHAZINE HCL 25 MG/ML IJ SOLN: 6.2500 mg | INTRAMUSCULAR | Status: DC | PRN

## 2020-01-07 MED ORDER — IBUPROFEN 600 MG PO TABS: 600.0000 mg | ORAL_TABLET | Freq: Four times a day (QID) | ORAL | 1 refills | Status: DC

## 2020-01-07 MED ORDER — PROPOFOL 10 MG/ML IV BOLUS
INTRAVENOUS | Status: DC | PRN
  Administered 2020-01-07: 150 mg via INTRAVENOUS

## 2020-01-07 MED ORDER — BUPIVACAINE HCL (PF) 0.25 % IJ SOLN
INTRAMUSCULAR | Status: DC | PRN
  Administered 2020-01-07: 15 mL

## 2020-01-07 MED ORDER — SUGAMMADEX SODIUM 200 MG/2ML IV SOLN
INTRAVENOUS | Status: DC | PRN
  Administered 2020-01-07: 200 mg via INTRAVENOUS

## 2020-01-07 MED ORDER — FENTANYL CITRATE (PF) 250 MCG/5ML IJ SOLN
INTRAMUSCULAR | Status: AC
  Filled 2020-01-07: qty 5

## 2020-01-07 MED ORDER — LIDOCAINE 2% (20 MG/ML) 5 ML SYRINGE
INTRAMUSCULAR | Status: DC | PRN
  Administered 2020-01-07: 80 mg via INTRAVENOUS

## 2020-01-07 MED ORDER — FENTANYL CITRATE (PF) 250 MCG/5ML IJ SOLN
INTRAMUSCULAR | Status: DC | PRN
  Administered 2020-01-07 (×4): 50 ug via INTRAVENOUS
  Administered 2020-01-07: 100 ug via INTRAVENOUS
  Administered 2020-01-07: 50 ug via INTRAVENOUS

## 2020-01-07 MED ORDER — IBUPROFEN 600 MG PO TABS: 600.0000 mg | ORAL_TABLET | Freq: Four times a day (QID) | ORAL | 1 refills | Status: AC

## 2020-01-07 MED ORDER — ACETAMINOPHEN 500 MG PO TABS
1000.0000 mg | ORAL_TABLET | Freq: Once | ORAL | Status: AC
  Administered 2020-01-07: 1000 mg via ORAL

## 2020-01-07 MED ORDER — FENTANYL CITRATE (PF) 100 MCG/2ML IJ SOLN
25.0000 ug | INTRAMUSCULAR | Status: DC | PRN
  Administered 2020-01-07: 25 ug via INTRAVENOUS

## 2020-01-07 MED ORDER — ACETAMINOPHEN 500 MG PO TABS
ORAL_TABLET | ORAL | Status: AC
  Filled 2020-01-07: qty 2

## 2020-01-07 MED ORDER — ONDANSETRON HCL 4 MG/2ML IJ SOLN
INTRAMUSCULAR | Status: DC | PRN
  Administered 2020-01-07: 4 mg via INTRAVENOUS

## 2020-01-07 MED ORDER — ROCURONIUM BROMIDE 10 MG/ML (PF) SYRINGE
PREFILLED_SYRINGE | INTRAVENOUS | Status: DC | PRN
  Administered 2020-01-07: 70 mg via INTRAVENOUS

## 2020-01-07 MED ORDER — PROPOFOL 10 MG/ML IV BOLUS
INTRAVENOUS | Status: AC
  Filled 2020-01-07: qty 20

## 2020-01-07 SURGICAL SUPPLY — 44 items
ADH SKN CLS APL DERMABOND .7 (GAUZE/BANDAGES/DRESSINGS) ×1
APL SRG 38 LTWT LNG FL B (MISCELLANEOUS)
APPLICATOR ARISTA FLEXITIP XL (MISCELLANEOUS) IMPLANT
BAG RETRIEVAL 10 (BASKET) ×1
BARRIER ADHS 3X4 INTERCEED (GAUZE/BANDAGES/DRESSINGS) IMPLANT
BRR ADH 4X3 ABS CNTRL BYND (GAUZE/BANDAGES/DRESSINGS)
CABLE HIGH FREQUENCY MONO STRZ (ELECTRODE) IMPLANT
CATH ROBINSON RED A/P 16FR (CATHETERS) IMPLANT
COVER MAYO STAND STRL (DRAPES) ×2 IMPLANT
COVER WAND RF STERILE (DRAPES) ×2 IMPLANT
DERMABOND ADVANCED (GAUZE/BANDAGES/DRESSINGS) ×1
DERMABOND ADVANCED .7 DNX12 (GAUZE/BANDAGES/DRESSINGS) ×1 IMPLANT
DURAPREP 26ML APPLICATOR (WOUND CARE) ×2 IMPLANT
GAUZE 4X4 16PLY RFD (DISPOSABLE) ×2 IMPLANT
GLOVE BIO SURGEON STRL SZ8 (GLOVE) IMPLANT
GLOVE BIOGEL PI IND STRL 7.0 (GLOVE) ×2 IMPLANT
GLOVE BIOGEL PI IND STRL 8 (GLOVE) ×1 IMPLANT
GLOVE BIOGEL PI INDICATOR 7.0 (GLOVE) ×2
GLOVE BIOGEL PI INDICATOR 8 (GLOVE) ×1
GOWN STRL REUS W/TWL LRG LVL3 (GOWN DISPOSABLE) ×2 IMPLANT
GOWN STRL REUS W/TWL XL LVL3 (GOWN DISPOSABLE) ×2 IMPLANT
HEMOSTAT ARISTA ABSORB 3G PWDR (HEMOSTASIS) IMPLANT
LIGASURE BLUNT TIP 5 LONG 44CM (ELECTROSURGICAL) IMPLANT
LIGASURE LAP L-HOOKWIRE 5 44CM (INSTRUMENTS) IMPLANT
LIGASURE VESSEL 5MM BLUNT TIP (ELECTROSURGICAL) ×2 IMPLANT
PACK LAPAROSCOPY BASIN (CUSTOM PROCEDURE TRAY) ×2 IMPLANT
PACK TRENDGUARD 450 HYBRID PRO (MISCELLANEOUS) ×1 IMPLANT
PAD OB MATERNITY 4.3X12.25 (PERSONAL CARE ITEMS) ×2 IMPLANT
PROTECTOR NERVE ULNAR (MISCELLANEOUS) ×4 IMPLANT
SCISSORS LAP 5X35 DISP (ENDOMECHANICALS) IMPLANT
SET SUCTION IRRIG HYDROSURG (IRRIGATION / IRRIGATOR) ×2 IMPLANT
SET TUBE SMOKE EVAC HIGH FLOW (TUBING) ×2 IMPLANT
SUT MNCRL AB 4-0 PS2 18 (SUTURE) ×4 IMPLANT
SUT MON AB 4-0 PC3 18 (SUTURE) IMPLANT
SUT VICRYL 0 UR6 27IN ABS (SUTURE) ×4 IMPLANT
SYS BAG RETRIEVAL 10MM (BASKET) ×1
SYSTEM BAG RETRIEVAL 10MM (BASKET) ×1 IMPLANT
TOWEL OR 17X26 10 PK STRL BLUE (TOWEL DISPOSABLE) ×2 IMPLANT
TRAY FOLEY W/BAG SLVR 14FR LF (SET/KITS/TRAYS/PACK) ×2 IMPLANT
TRENDGUARD 450 HYBRID PRO PACK (MISCELLANEOUS) ×2
TROCAR BALLN 12MMX100 BLUNT (TROCAR) ×2 IMPLANT
TROCAR BLADELESS OPT 5 100 (ENDOMECHANICALS) ×4 IMPLANT
TROCAR XCEL NON-BLD 11X100MML (ENDOMECHANICALS) IMPLANT
WARMER LAPAROSCOPE (MISCELLANEOUS) ×2 IMPLANT

## 2020-01-07 NOTE — Discharge Instructions (Signed)
Post Anesthesia Home Care Instructions  Activity: Get plenty of rest for the remainder of the day. A responsible adult should stay with you for 24 hours following the procedure.  For the next 24 hours, DO NOT: -Drive a car -Operate machinery -Drink alcoholic beverages -Take any medication unless instructed by your physician -Make any legal decisions or sign important papers.  Meals: Start with liquid foods such as gelatin or soup. Progress to regular foods as tolerated. Avoid greasy, spicy, heavy foods. If nausea and/or vomiting occur, drink only clear liquids until the nausea and/or vomiting subsides. Call your physician if vomiting continues.  Special Instructions/Symptoms: Your throat may feel dry or sore from the anesthesia or the breathing tube placed in your throat during surgery. If this causes discomfort, gargle with warm salt water. The discomfort should disappear within 24 hours.  If you had a scopolamine patch placed behind your ear for the management of post- operative nausea and/or vomiting:  1. The medication in the patch is effective for 72 hours, after which it should be removed.  Wrap patch in a tissue and discard in the trash. Wash hands thoroughly with soap and water. 2. You may remove the patch earlier than 72 hours if you experience unpleasant side effects which may include dry mouth, dizziness or visual disturbances. 3. Avoid touching the patch. Wash your hands with soap and water after contact with the patch.   DISCHARGE INSTRUCTIONS: Laparoscopy  The following instructions have been prepared to help you care for yourself upon your return home today.  Wound care: . Do not get the incision wet for the first 24 hours. The incision should be kept clean and dry. . The Band-Aids or dressings may be removed the day after surgery. . Should the incision become sore, red, and swollen after the first week, check with your doctor.  Personal hygiene: . Shower the day  after your procedure.  Activity and limitations: . Do NOT drive or operate any equipment today. . Do NOT lift anything more than 15 pounds for 2-3 weeks after surgery. . Do NOT rest in bed all day. . Walking is encouraged. Walk each day, starting slowly with 5-minute walks 3 or 4 times a day. Slowly increase the length of your walks. . Walk up and down stairs slowly. . Do NOT do strenuous activities, such as golfing, playing tennis, bowling, running, biking, weight lifting, gardening, mowing, or vacuuming for 2-4 weeks. Ask your doctor when it is okay to start.  Diet: Eat a light meal as desired this evening. You may resume your usual diet tomorrow.  Return to work: This is dependent on the type of work you do. For the most part you can return to a desk job within a week of surgery. If you are more active at work, please discuss this with your doctor.  What to expect after your surgery: You may have a slight burning sensation when you urinate on the first day. You may have a very small amount of blood in the urine. Expect to have a small amount of vaginal discharge/light bleeding for 1-2 weeks. It is not unusual to have abdominal soreness and bruising for up to 2 weeks. You may be tired and need more rest for about 1 week. You may experience shoulder pain for 24-72 hours. Lying flat in bed may relieve it.  Call your doctor for any of the following: . Develop a fever of 100.4 or greater . Inability to urinate 6 hours after discharge   from hospital . Severe pain not relieved by pain medications . Persistent of heavy bleeding at incision site . Redness or swelling around incision site after a week . Increasing nausea or vomiting  Patient Signature________________________________________ Nurse Signature_________________________________________ 

## 2020-01-07 NOTE — Anesthesia Preprocedure Evaluation (Addendum)
Anesthesia Evaluation  Patient identified by MRN, date of birth, ID band Patient awake    Reviewed: Allergy & Precautions, NPO status , Patient's Chart, lab work & pertinent test results  History of Anesthesia Complications (+) PONV and history of anesthetic complications  Airway Mallampati: II  TM Distance: >3 FB Neck ROM: Full    Dental no notable dental hx.    Pulmonary neg pulmonary ROS,    Pulmonary exam normal breath sounds clear to auscultation       Cardiovascular hypertension, Normal cardiovascular exam Rhythm:Regular Rate:Normal     Neuro/Psych negative neurological ROS  negative psych ROS   GI/Hepatic Neg liver ROS, GERD  Medicated,  Endo/Other  negative endocrine ROS  Renal/GU negative Renal ROS  negative genitourinary   Musculoskeletal  (+) Arthritis ,   Abdominal Normal abdominal exam  (+)   Peds  Hematology negative hematology ROS (+)   Anesthesia Other Findings   Reproductive/Obstetrics negative OB ROS                            Anesthesia Physical Anesthesia Plan  ASA: II  Anesthesia Plan: General   Post-op Pain Management:    Induction:   PONV Risk Score and Plan: Midazolam, Dexamethasone, Ondansetron and Treatment may vary due to age or medical condition  Airway Management Planned: Oral ETT  Additional Equipment: None  Intra-op Plan:   Post-operative Plan: Extubation in OR  Informed Consent:   Plan Discussed with: CRNA and Anesthesiologist  Anesthesia Plan Comments:         Anesthesia Quick Evaluation

## 2020-01-07 NOTE — Op Note (Signed)
Name: Jody Lucas  Age: 64 y.o.. Date of Birth: Apr 01, 1955 Medical Record #: [id number]  Operative Note  Preoperative Diagnosis: Right lower quadrant pain, bilateral ovarian cysts, possible intermittent right ovarian torsion Procedure: Operative laparoscopy, bilateral salpingo-oophorectomy, pelvic washings, lysis of adhesions.  Postoperative Diagnosis: same Surgeon: Joseph Pierini, MD Assistant: Princess Bruins, MD Estimated Blood Loss: 5 mL Specimens: Right and left fallopian tube and ovary with cyst, pelvic washings for cytology Findings: Normal appearing bilateral fallopian tubes and ovaries, left ovary with 3 cm simple cyst.  Surgically absent uterus.  No evidence of active ovarian torsion.  Filmy adhesion to right pelvic sidewall causing slight kink in loop of small bowel. Normal liver edge and partially retrocecal appendix with filmy adhesion of distal appendix to right abdominal sidewall. Anesthesia: General, local 0.25% bupivacaine Complications: None. Date: 01/07/20  Under general anesthesia, CUMI SANAGUSTIN was placed in the dorsolithotomy position in [place] stirrups, underwent bimanual exam and was prepped and draped in the usual sterile fashion. A surgical team time out was performed to verify and agree on procedure and patient consent. An red Robinson catheter was passed through the urethra and was productive of clear urine.  A sponge stick was placed in the vaginal cavity.  The open Hasson technique was used by injecting 0.25% bupivacaine in the supraumbilical skin and making a 15 mm incision with the 11 blade scalpel.  The subcutaneous tissues were bluntly dissected to expose the fascia which was grasped with Kocher clamps and sharply entered with the Mayo scissors.  Peritoneum was bluntly dissected away and intraperitoneal access was confirmed.  A #0-Vicryl suture was used to pursestring around the fascial opening.  The Hasson trocar was inserted and intraperitoneal   position was confirmed.  The balloon was inflated with an air filled syringe and pneumoperitoneum was obtained to 15 mmHg.  The 10 mm laparoscope was inserted verifying an intraperitoneal position as well as verifying absence of injury to internal organs.  No injury to the omentum or bowel was noted after entry into the abdomen.  She was placed in slight Trendelenburg position.   Intraabdominal survey was performed with findings noted above.  A 5 mm left lateral and 5 mm right lateral incision were made 3 cm medial and superior to the anterior superior iliac spines bilaterally and injected with local.  Two 5 mm trocars were placed under laparoscopic guidance into their respective incisions.  Patient was placed into Trendelenburg position.  The pelvis was irrigated and the contents were collected and sent to pathology for cytology analysis.  The course of each ureter was identified.  The left infundibulopelvic ligament was doubly cauterized and transected using a LigaSure bipolar device.  The left fallopian tube was ligated and freed.  The fallopian tube and ovary specimen was set in the pelvic cul de sac.  The same procedure was performed for the right side.  The adhesion of the small bowel to the right pelvic sidewall was grasped with the [place] grapser and noted to be filmy and free of any critical structures.  The monopolar endoscopic scissor was used to incise the adhesion, freeing up the mild kink in the associated portion of small bowel.  The same procedure was carried out to free up the distal appendix attachment to the right abdominal sidewall without concern for any thermal injury to the structure.  The pelvic cavity and all pedicles appeared dry and hemostatic. The laparoscope was switched to the 5 mm size and this was inserted into the  lower quadrant trocar.  A 10 mm endoscopic bag was then inserted through the infraumbilical trocar and under direct visualization and both specimens were placed into  the bag intact.  The trocar balloon was deflated and the trocar was removed.  The bag was brought up to the surface of the abdominal wall to expose the cyst in the bag.  The cyst was sharply ruptured within the bag and drained of its fluid contents with the suction irrigator. The bag and contained specimens was removed from the abdominal cavity and passed off to be sent to pathology for analysis. The Hasson trocar was then placed back into the infraumbilical incision and the balloon inflated. Pneumoperitoneum was reestablished.  The abdominal cavity was explored laparoscopically and appeared hemostatic at all pedicles even as again pneumoperitoneum was evacuated.  Ureters were visibly free from injury with normal vermiculation noted bilaterally.  The lower quadrant trocars were removed under direct visualization and pneumoperitoneum was evacuated.  The Hasson trocar was deflated and removed.  The pursestring suture was tied and the fascial opening was closed and intact without defect on palpation.  The skin incisions were each closed using #4-0 Monocryl with a running subcutaneous stitch and covered with Dermabond.  The sponge stick was removed from the vagina.  Sponge, instrument, and needle counts were correct at closure of vaginal cuff and also at the conclusion of the procedure. The patient tolerated the procedure well and she was taken to the recovery room in stable condition.   A qualified surgical assistant was required as an active participant in this case because skilled help was needed in retraction of the operative site, safe exposure of anatomy, cutting suture materials, assistance with obtaining hemostasis, and closure of the surgical wounds. The surgical assistant was actively involved during the entire portion of this surgery.   Joseph Pierini, M.D., Cherlynn June

## 2020-01-07 NOTE — H&P (Signed)
Jody Lucas Sep 05, 1955 MRN: [id number]  HPI The patient is a 64 y.o. G3P3003 who presents today for scheduled laparoscopic bilateral salpingo-oophorectomy and pelvic washings for bilateral ovarian cysts and RLQ pain with suspected intermittent right ovarian torsion.  No changes to her medical history since her pre op exam, denies CP, SOB, fever/chills, dysuria.  She is still having significant pain intermittently in the RLQ.  Past Medical History:  Diagnosis Date  . Cyst of ovary 12/2019   persistant left cyst and possible intermittant right cyst  . GERD (gastroesophageal reflux disease)   . Heart murmur    per pt told approx 2010 had murmur by pcp but work up done , stated no issues  . History of basal cell carcinoma (BCC) excision 12/2018   s/p  moh's surgery of forehead  . History of chronic gastritis   . Hyperlipidemia   . Hypertension    followed by pcp  . OA (osteoarthritis)   . Osteoporosis   . Pelvic pain   . PONV (postoperative nausea and vomiting)    Past Surgical History:  Procedure Laterality Date  . CATARACT EXTRACTION W/ INTRAOCULAR LENS IMPLANT Left 2018  . COLONOSCOPY  last one 05-09-2012  dr Henrene Pastor  . ESOPHAGOGASTRODUODENOSCOPY  2008  . FOOT SURGERY Bilateral left 01/ 2015; right 02/ 2015   bunion and hammertoe repair each for foot  . MOHS SURGERY  12/2018   forehead  . TONSILLECTOMY  age 33  . VAGINAL HYSTERECTOMY  1988   irregular bleeding/CIN grade 2  . WISDOM TOOTH EXTRACTION     Allergies  Allergen Reactions  . Codeine Nausea And Vomiting and Swelling   No current facility-administered medications on file prior to encounter.   Current Outpatient Medications on File Prior to Encounter  Medication Sig Dispense Refill  . CALCIUM PO Take 1,000 mg by mouth daily.    . cephALEXin (KEFLEX) 500 MG capsule Take 500 mg by mouth 4 (four) times daily.    . Cholecalciferol (VITAMIN D PO) Take 1,600 Units by mouth daily.    . Multiple Vitamin  (MULTIVITAMIN) tablet Take 1 tablet by mouth daily.    . naproxen (NAPROSYN) 500 MG tablet Take 500 mg by mouth 2 (two) times daily with a meal.    . omeprazole (PRILOSEC) 20 MG capsule TAKE (1) CAPSULE DAILY. 30 capsule 6  . traMADol (ULTRAM) 50 MG tablet Take by mouth every 6 (six) hours as needed.    . triamterene-hydrochlorothiazide (DYAZIDE) 37.5-25 MG capsule Take 0.5 capsules by mouth daily.       Physical Exam   BP (!) 160/87   Pulse 66   Temp (!) 97.4 F (36.3 C) (Oral)   Resp 16   Ht 5\' 3"  (1.6 m)   Wt 64.5 kg   SpO2 100%   BMI 25.21 kg/m      General: Pleasant female, no acute distress, alert and oriented CV: RRR, no murmurs Pulm: good respiratory effort, CTAB     Plan Proceed with laparoscopic BSO and pelvic washings as planned.  We again discussed we may not see any significant findings and the RLQ pain may potentially not improve after this surgery if caused by something else other than what we are able to evaluate in surgery today.  The hyponatremia has resolved.  Post operative pain management reviewed and she indicates she has a sufficient supply of tramadol remaining so will only send in rx for Motrin.  All questions answered and the patient agrees to  proceed.   Joseph Pierini, MD 01/07/20

## 2020-01-07 NOTE — Transfer of Care (Signed)
Immediate Anesthesia Transfer of Care Note  Patient: Jody Lucas  Procedure(s) Performed: LAPAROSCOPIC BILATERAL SALPINGO OOPHORECTOMY LYSIS OF ADHESIONS PELVIC WASHINGS (Bilateral )  Patient Location: PACU  Anesthesia Type:General  Level of Consciousness: sedated  Airway & Oxygen Therapy: Patient Spontanous Breathing and Patient connected to face mask oxygen  Post-op Assessment: Report given to RN and Post -op Vital signs reviewed and stable  Post vital signs: Reviewed and stable  Last Vitals:  Vitals Value Taken Time  BP 157/100 01/07/20 1437  Temp    Pulse 74 01/07/20 1438  Resp 13 01/07/20 1438  SpO2 100 % 01/07/20 1438  Vitals shown include unvalidated device data.  Last Pain:  Vitals:   01/07/20 1134  TempSrc: Oral  PainSc: 3       Patients Stated Pain Goal: 5 (16/10/96 0454)  Complications: No complications documented.

## 2020-01-07 NOTE — Anesthesia Procedure Notes (Signed)
Procedure Name: Intubation Date/Time: 01/07/2020 12:54 PM Performed by: Talbot Grumbling, CRNA Pre-anesthesia Checklist: Patient identified, Emergency Drugs available, Suction available and Patient being monitored Patient Re-evaluated:Patient Re-evaluated prior to induction Oxygen Delivery Method: Circle system utilized Preoxygenation: Pre-oxygenation with 100% oxygen Induction Type: IV induction Ventilation: Mask ventilation without difficulty Laryngoscope Size: Mac and 3 Grade View: Grade I Tube type: Oral Tube size: 7.0 mm Number of attempts: 1 Airway Equipment and Method: Stylet Placement Confirmation: ETT inserted through vocal cords under direct vision,  positive ETCO2 and breath sounds checked- equal and bilateral Secured at: 21 cm Tube secured with: Tape Dental Injury: Teeth and Oropharynx as per pre-operative assessment

## 2020-01-08 ENCOUNTER — Encounter (HOSPITAL_BASED_OUTPATIENT_CLINIC_OR_DEPARTMENT_OTHER): Payer: Self-pay | Admitting: Obstetrics and Gynecology

## 2020-01-08 LAB — SURGICAL PATHOLOGY

## 2020-01-08 LAB — CYTOLOGY - NON PAP

## 2020-01-08 NOTE — Anesthesia Postprocedure Evaluation (Signed)
Anesthesia Post Note  Patient: Jody Lucas  Procedure(s) Performed: LAPAROSCOPIC BILATERAL SALPINGO OOPHORECTOMY LYSIS OF ADHESIONS PELVIC WASHINGS (Bilateral )     Patient location during evaluation: PACU Anesthesia Type: General Level of consciousness: sedated Pain management: pain level controlled Vital Signs Assessment: post-procedure vital signs reviewed and stable Respiratory status: spontaneous breathing and respiratory function stable Cardiovascular status: stable Postop Assessment: no apparent nausea or vomiting Anesthetic complications: no   No complications documented.  Last Vitals:  Vitals:   01/07/20 1530 01/07/20 1630  BP: (!) 165/92 (!) 158/82  Pulse: 66 71  Resp: 10 16  Temp:  36.5 C  SpO2: 95% 95%    Last Pain:  Vitals:   01/07/20 1630  TempSrc:   PainSc: 0-No pain                 Merlinda Frederick

## 2020-01-21 ENCOUNTER — Ambulatory Visit (INDEPENDENT_AMBULATORY_CARE_PROVIDER_SITE_OTHER): Payer: 59 | Admitting: Obstetrics and Gynecology

## 2020-01-21 ENCOUNTER — Other Ambulatory Visit: Payer: Self-pay

## 2020-01-21 ENCOUNTER — Encounter: Payer: Self-pay | Admitting: Obstetrics and Gynecology

## 2020-01-21 VITALS — BP 124/78

## 2020-01-21 DIAGNOSIS — Z9889 Other specified postprocedural states: Secondary | ICD-10-CM

## 2020-01-21 DIAGNOSIS — Z90722 Acquired absence of ovaries, bilateral: Secondary | ICD-10-CM

## 2020-01-21 NOTE — Progress Notes (Signed)
   Jody Lucas 15-Jan-1956 [id number]  REASON FOR APPOINTMENT Chief Complaint  Patient presents with  . Post op check     HISTORY OF PRESENT ILLNESS Jody Lucas presents for routine 2 week post-operative follow up after a laparoscopic bilateral salpingo-oophorectomy and lysis of adhesions with pelvic washings performed on 01/07/2020 for severe right lower quadrant pain and finding of bilateral ovarian cysts on ultrasound.  The patient is doing well with no concerns.  She says her RLQ pain is completely resolved since having the surgery.  She denies vaginal bleeding or discharge. She is tolerating normal diet.  Bowel and bladder function are normal.  No surgical pains at this point.  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  OBJECTIVE   BP 124/78    PHYSICAL EXAM General:  Patient in no acute distress.  Abdomen: Soft, non tender, non-distended.  Incisions at bilateral lower quadrants and umbilicus are clean, dry, intact, well healing, no erythema nor drainage.  ASSESSMENT / PLAN  Routine 2 week post operative check.   The patient is doing well and meeting all postoperative milestones.  I reviewed the benign pathology report.  I showed her some pictures taken during the laparoscopy.  We described how he loosened up some adhesions around the appendix and also attachments to the small bowel at the right pelvic brim.  Her pain has resolved so she is doing very well at this point.  Encouraged her to follow 2 more weeks of from abdominal exercise or lifting more than 20 pounds, otherwise she may return to normal activities without restriction and resume normal gynecologic care at this point.   Joseph Pierini MD 01/21/20

## 2020-02-25 ENCOUNTER — Encounter: Payer: Self-pay | Admitting: Obstetrics and Gynecology

## 2020-02-25 ENCOUNTER — Other Ambulatory Visit: Payer: Self-pay

## 2020-02-25 ENCOUNTER — Ambulatory Visit (INDEPENDENT_AMBULATORY_CARE_PROVIDER_SITE_OTHER): Payer: 59 | Admitting: Obstetrics and Gynecology

## 2020-02-25 VITALS — BP 120/80 | Ht 62.0 in | Wt 148.0 lb

## 2020-02-25 DIAGNOSIS — M899 Disorder of bone, unspecified: Secondary | ICD-10-CM

## 2020-02-25 DIAGNOSIS — Z01419 Encounter for gynecological examination (general) (routine) without abnormal findings: Secondary | ICD-10-CM

## 2020-02-25 DIAGNOSIS — M810 Age-related osteoporosis without current pathological fracture: Secondary | ICD-10-CM | POA: Diagnosis not present

## 2020-02-25 DIAGNOSIS — R1030 Lower abdominal pain, unspecified: Secondary | ICD-10-CM | POA: Diagnosis not present

## 2020-02-25 NOTE — Progress Notes (Signed)
Jody Lucas [birth year] [id number]  SUBJECTIVE:  64 y.o. G3P3003 female here for a breast and pelvic exam. She has no gynecologic concerns.  Still doing well since her laparoscopic BSO just under 2 months ago.  Has been started to have a little pain in the pubic bone area more on the right side, different from the pain that she was having prior to the surgery in the way that it is located more over the pubic bone, the pain she had prior to the surgery was more in the right lower quadrant and that has resolved.  She says the pain comes and jolts and spurts and only lasts for several seconds but can be very sharp until it self resolves.  Current Outpatient Medications  Medication Sig Dispense Refill  . CALCIUM PO Take 1,000 mg by mouth daily.    . Cholecalciferol (VITAMIN D PO) Take 1,600 Units by mouth daily.    . Multiple Vitamin (MULTIVITAMIN) tablet Take 1 tablet by mouth daily.    Marland Kitchen omeprazole (PRILOSEC) 20 MG capsule TAKE (1) CAPSULE DAILY. 30 capsule 6   No current facility-administered medications for this visit.   Allergies: Codeine  No LMP recorded. Patient has had a hysterectomy.  Past medical history,surgical history, problem list, medications, allergies, family history and social history were all reviewed and documented as reviewed in the EPIC chart.  GYN ROS: no abnormal bleeding, pelvic pain or discharge, no breast pain or new or enlarging lumps on self exam.  No dysuria, frequency, burning, pain with urination, cloudy/malodorous urine.   OBJECTIVE:  BP 120/80   Ht 5\' 2"  (1.575 m)   Wt 148 lb (67.1 kg)   BMI 27.07 kg/m  The patient appears well, alert, oriented, in no distress.  BREAST EXAM: breasts appear normal, no suspicious masses, no skin or nipple changes or axillary nodes The 3 abdominal incisions are clean, dry, intact, well-healing, no erythema or ecchymosis.  Mild tenderness over the right aspect of the pubic bone near abdominal musculature  insertion. PELVIC EXAM: VULVA: normal appearing vulva with atrophic change, no masses, tenderness or lesions, VAGINA: normal appearing vagina with trophic change, normal color and discharge, no lesions, CERVIX: surgically absent, UTERUS: surgically absent, vaginal cuff normal, ADNEXA: no masses, nontender  Chaperone: [name] present during the examination  ASSESSMENT:  64 y.o. G3P3003 here for a breast and pelvic exam  PLAN:   1. Postmenopausal. 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  More recently s/p BSO for pelvic pain and ovarian cysts.  No significant hot flashes or night sweats.  No vaginal bleeding.  2. Pap smear 02/2018.  History LGSIL 1988 and normal Pap smears otherwise.  Will consider performing Pap smear at the next 3-year interval in 2022 and further discussed the screening guidelines at that time. 3. Mammogram 04/2018.  Normal breast exam today. She is reminded to schedule an annual mammogram and she acknowledges the recommendation and plans to do this just after the new year. 4.  Lower abdominal/pubic bone area pain.  She thinks that she might have 'pulled something' in her Pilates class and the examination today would suggest that she has a muscle strain or tendonitis in that area so I recommend that she talk to her primary doctor about referral for PT evaluation.  She can use heat over the area which she does find soothing.  We will check urinalysis just to more definitively rule out acute cystitis. 5. Colonoscopy 2014.  She will follow up at the interval recommended  by her GI specialist.  6. Osteoporosis followed by Dr. Joylene Draft.  DEXA 2019. 7. Health maintenance.  No labs today as she normally has these completed elsewhere.  Return annually or sooner, prn.  Joseph Pierini MD 02/25/20

## 2020-02-26 ENCOUNTER — Encounter: Payer: Self-pay | Admitting: Obstetrics and Gynecology

## 2020-02-26 LAB — URINALYSIS, COMPLETE W/RFL CULTURE

## 2020-02-26 LAB — NO CULTURE INDICATED

## 2021-03-24 NOTE — Progress Notes (Signed)
66 y.o. G53P3003 Married White or Caucasian Not Hispanic or Latino female here for annual exam.   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  H/O BSO in 2021 for pelvic pain and ovarian cysts. Benign pathology.   Sexually active, no pain.  No bowel or bladder c/o.     No LMP recorded. Patient has had a hysterectomy.          Sexually active: Yes.    The current method of family planning is status post hysterectomy.    Exercising: Yes.     Walking  & pilates  Smoker:  no  Health Maintenance: Pap:  02/14/18 WNL; 12/16/15 WNL  History of abnormal Pap:  yes in her 70's, had a hysterectomy for HSIL.   MMG:  patient reports a normal mammogram in 2/22, has an appointment for next month.  BMD:   04/2020 osteoporosis, with her primary (just started on Bonivia 6 months ago).  Colonoscopy: 2014 f/u 10 years  TDaP:  2021 Gardasil: n/a   reports that she has never smoked. She has never used smokeless tobacco. She reports that she does not currently use alcohol. She reports that she does not use drugs. She has ~ glasses of wine a week. 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 3 grown children, all live in [place]. 2 grandsons (9 and 6), both from the same daughter.   Past Medical History:  Diagnosis Date   Cyst of ovary 12/2019   persistant left cyst and possible intermittant right cyst   GERD (gastroesophageal reflux disease)    Heart murmur    per pt told approx 2010 had murmur by pcp but work up done , stated no issues   History of basal cell carcinoma (BCC) excision 12/2018   s/p  moh's surgery of forehead   History of chronic gastritis    Hyperlipidemia    Hypertension    followed by pcp   OA (osteoarthritis)    Osteoporosis    Pelvic pain    PONV (postoperative nausea and vomiting)     Past Surgical History:  Procedure Laterality Date   CATARACT EXTRACTION W/ INTRAOCULAR LENS IMPLANT Left 2018   COLONOSCOPY  last one 05-09-2012  dr Henrene Pastor    ESOPHAGOGASTRODUODENOSCOPY  2008   FOOT SURGERY Bilateral left 01/ 2015; right 02/ 2015   bunion and hammertoe repair each for foot   LAPAROSCOPIC BILATERAL SALPINGO OOPHERECTOMY Bilateral 01/07/2020   Procedure: LAPAROSCOPIC BILATERAL SALPINGO OOPHORECTOMY LYSIS OF ADHESIONS PELVIC WASHINGS;  Surgeon: Joseph Pierini, MD;  Location: [place];  Service: Gynecology;  Laterality: Bilateral;  request 1:00pm OR time in [place] held time requests one hour OR time   MOHS SURGERY  12/2018   forehead   TONSILLECTOMY  age 58   VAGINAL HYSTERECTOMY  1988   irregular bleeding/CIN grade 2   WISDOM TOOTH EXTRACTION      Current Outpatient Medications  Medication Sig Dispense Refill   CALCIUM PO Take 1,000 mg by mouth daily.     Cholecalciferol (VITAMIN D PO) Take 1,600 Units by mouth daily.     ibandronate (BONIVA) 150 MG tablet 1 tablet     Multiple Vitamin (MULTIVITAMIN) tablet Take 1 tablet by mouth daily.     omeprazole (PRILOSEC) 20 MG capsule TAKE (1) CAPSULE DAILY. 30 capsule 6   No current facility-administered medications for this visit.    Family History  Problem Relation Age of Onset  Brain cancer Father    Fibromyalgia Mother    Osteoporosis Mother    Osteoarthritis Mother    Rheum arthritis Son    Colon cancer Neg Hx    Rectal cancer Neg Hx    Stomach cancer Neg Hx     Review of Systems  All other systems reviewed and are negative.  Exam:   BP 120/64    Pulse 68    Ht 5\' 3"  (1.6 m)    Wt 143 lb (64.9 kg)    SpO2 98%    BMI 25.33 kg/m   Weight change: @WEIGHTCHANGE @ Height:   Height: 5\' 3"  (160 cm)  Ht Readings from Last 3 Encounters:  03/29/21 5\' 3"  (1.6 m)  02/25/20 5\' 2"  (1.575 m)  01/07/20 5\' 3"  (1.6 m)    General appearance: alert, cooperative and appears stated age Head: Normocephalic, without obvious abnormality, atraumatic Neck: no adenopathy, supple, symmetrical, trachea midline and thyroid normal to inspection and  palpation Lungs: clear to auscultation bilaterally Cardiovascular: regular rate and rhythm Breasts: normal appearance, no masses or tenderness Abdomen: soft, non-tender; non distended,  no masses,  no organomegaly Extremities: extremities normal, atraumatic, no cyanosis or edema Skin: Skin color, texture, turgor normal. No rashes or lesions Lymph nodes: Cervical, supraclavicular, and axillary nodes normal. No abnormal inguinal nodes palpated Neurologic: Grossly normal   Pelvic: External genitalia:  no lesions              Urethra:  normal appearing urethra with no masses, tenderness or lesions              Bartholins and Skenes: normal                 Vagina: normal appearing vagina with normal color and discharge, no lesions              Cervix: absent               Bimanual Exam:  Uterus:  uterus absent              Adnexa: no mass, fullness, tenderness               Rectovaginal: Confirms               Anus:  normal sphincter tone, no lesions  Gae Dry chaperoned for the exam.  1. Well woman exam Discussed breast self exam Discussed calcium and vit D intake Mammogram scheduled next month Colonoscopy UTD Labs and DEXA with primary  2. Screening for cervical cancer H/O TVH for HSIL in 1988 - Cytology - PAP

## 2021-03-29 ENCOUNTER — Other Ambulatory Visit (HOSPITAL_COMMUNITY)
Admission: RE | Admit: 2021-03-29 | Discharge: 2021-03-29 | Disposition: A | Discharge status: Home / Self Care | Payer: 59 | Source: Ambulatory Visit | Attending: Obstetrics and Gynecology | Admitting: Obstetrics and Gynecology

## 2021-03-29 ENCOUNTER — Encounter: Payer: Self-pay | Admitting: Obstetrics and Gynecology

## 2021-03-29 ENCOUNTER — Ambulatory Visit (INDEPENDENT_AMBULATORY_CARE_PROVIDER_SITE_OTHER): Payer: 59 | Admitting: Obstetrics and Gynecology

## 2021-03-29 ENCOUNTER — Other Ambulatory Visit: Payer: Self-pay

## 2021-03-29 VITALS — BP 120/64 | HR 68 | Ht 63.0 in | Wt 143.0 lb

## 2021-03-29 DIAGNOSIS — Z124 Encounter for screening for malignant neoplasm of cervix: Secondary | ICD-10-CM | POA: Insufficient documentation

## 2021-03-29 DIAGNOSIS — Z01419 Encounter for gynecological examination (general) (routine) without abnormal findings: Secondary | ICD-10-CM

## 2021-03-29 DIAGNOSIS — M4310 Spondylolisthesis, site unspecified: Secondary | ICD-10-CM | POA: Insufficient documentation

## 2021-03-29 DIAGNOSIS — K5900 Constipation, unspecified: Secondary | ICD-10-CM | POA: Insufficient documentation

## 2021-03-29 DIAGNOSIS — Z90722 Acquired absence of ovaries, bilateral: Secondary | ICD-10-CM | POA: Insufficient documentation

## 2021-03-29 DIAGNOSIS — I872 Venous insufficiency (chronic) (peripheral): Secondary | ICD-10-CM | POA: Insufficient documentation

## 2021-03-29 DIAGNOSIS — R911 Solitary pulmonary nodule: Secondary | ICD-10-CM | POA: Insufficient documentation

## 2021-03-29 DIAGNOSIS — M47816 Spondylosis without myelopathy or radiculopathy, lumbar region: Secondary | ICD-10-CM | POA: Insufficient documentation

## 2021-03-29 NOTE — Patient Instructions (Signed)

## 2021-03-30 ENCOUNTER — Encounter: Payer: Self-pay | Admitting: Obstetrics and Gynecology

## 2021-03-30 LAB — CYTOLOGY - PAP
Comment: NEGATIVE
Diagnosis: NEGATIVE
High risk HPV: NEGATIVE

## 2022-04-10 NOTE — Progress Notes (Deleted)
67 y.o. G27P3003 Married White or Caucasian Not Hispanic or Latino female here for annual exam.      No LMP recorded. Patient has had a hysterectomy.          Sexually active: {yes no:314532}  The current method of family planning is {contraception:315051}.    Exercising: {yes no:314532}  {types:19826} Smoker:  {YES P5382123  Health Maintenance: Pap:   02/14/18 WNL; 12/16/15 WNL   History of abnormal Pap:  {YES NO:22349} MMG:  06/09/20 bi-rads 1 neg *** done at [facility]  BMD:     04/2020 osteoporosis, with her primary (just started on Bonivia 6 months ago).   Colonoscopy: 05/09/2012 f/u 10 years  TDaP:03/13/10  *** Gardasil: n/a   reports that she has never smoked. She has never used smokeless tobacco. She reports that she does not currently use alcohol. She reports that she does not use drugs.  Past Medical History:  Diagnosis Date   Cyst of ovary 12/2019   persistant left cyst and possible intermittant right cyst   GERD (gastroesophageal reflux disease)    Heart murmur    per pt told approx 2010 had murmur by pcp but work up done , stated no issues   History of basal cell carcinoma (BCC) excision 12/2018   s/p  moh's surgery of forehead   History of chronic gastritis    Hyperlipidemia    Hypertension    followed by pcp   OA (osteoarthritis)    Osteoporosis    Pelvic pain    PONV (postoperative nausea and vomiting)     Past Surgical History:  Procedure Laterality Date   CATARACT EXTRACTION W/ INTRAOCULAR LENS IMPLANT Left 2018   COLONOSCOPY  last one 05-09-2012  dr Henrene Pastor   ESOPHAGOGASTRODUODENOSCOPY  2008   FOOT SURGERY Bilateral left 01/ 2015; right 02/ 2015   bunion and hammertoe repair each for foot   LAPAROSCOPIC BILATERAL SALPINGO OOPHERECTOMY Bilateral 01/07/2020   Procedure: LAPAROSCOPIC BILATERAL SALPINGO OOPHORECTOMY LYSIS OF ADHESIONS PELVIC WASHINGS;  Surgeon: Joseph Pierini, MD;  Location: [facility];  Service: Gynecology;  Laterality: Bilateral;   request 1:00pm OR time in [place] held time requests one hour OR time   MOHS SURGERY  12/2018   forehead   TONSILLECTOMY  age 82   VAGINAL HYSTERECTOMY  1988   irregular bleeding/CIN grade 2   WISDOM TOOTH EXTRACTION      Current Outpatient Medications  Medication Sig Dispense Refill   CALCIUM PO Take 1,000 mg by mouth daily.     Cholecalciferol (VITAMIN D PO) Take 1,600 Units by mouth daily.     ibandronate (BONIVA) 150 MG tablet 1 tablet     Multiple Vitamin (MULTIVITAMIN) tablet Take 1 tablet by mouth daily.     omeprazole (PRILOSEC) 20 MG capsule TAKE (1) CAPSULE DAILY. 30 capsule 6   No current facility-administered medications for this visit.    Family History  Problem Relation Age of Onset   Brain cancer Father    Fibromyalgia Mother    Osteoporosis Mother    Osteoarthritis Mother    Rheum arthritis Son    Colon cancer Neg Hx    Rectal cancer Neg Hx    Stomach cancer Neg Hx     Review of Systems  Exam:   There were no vitals taken for this visit.  Weight change: '@WEIGHTCHANGE'$ @ Height:      Ht Readings from Last 3 Encounters:  03/29/21 '5\' 3"'$  (1.6 m)  02/25/20 '5\' 2"'$  (1.575  m)  01/07/20 '5\' 3"'$  (1.6 m)    General appearance: alert, cooperative and appears stated age Head: Normocephalic, without obvious abnormality, atraumatic Neck: no adenopathy, supple, symmetrical, trachea midline and thyroid {CHL AMB PHY EX THYROID NORM [phone]::"normal to inspection and palpation"} Lungs: clear to auscultation bilaterally Cardiovascular: regular rate and rhythm Breasts: {Exam; breast:13139::"normal appearance, no masses or tenderness"} Abdomen: soft, non-tender; non distended,  no masses,  no organomegaly Extremities: extremities normal, atraumatic, no cyanosis or edema Skin: Skin color, texture, turgor normal. No rashes or lesions Lymph nodes: Cervical, supraclavicular, and axillary nodes normal. No abnormal inguinal nodes palpated Neurologic:  Grossly normal   Pelvic: External genitalia:  no lesions              Urethra:  normal appearing urethra with no masses, tenderness or lesions              Bartholins and Skenes: normal                 Vagina: normal appearing vagina with normal color and discharge, no lesions              Cervix: {CHL AMB PHY EX CERVIX NORM DEFAULT:[phone]::"no lesions"}               Bimanual Exam:  Uterus:  {CHL AMB PHY EX UTERUS NORM DEFAULT:[phone]::"normal size, contour, position, consistency, mobility, non-tender"}              Adnexa: {CHL AMB PHY EX ADNEXA NO MASS [phone]::"no mass, fullness, tenderness"}               Rectovaginal: Confirms               Anus:  normal sphincter tone, no lesions  *** chaperoned for the exam.  A:  Well Woman with normal exam  P:

## 2022-04-19 ENCOUNTER — Ambulatory Visit: Payer: 59 | Admitting: Obstetrics and Gynecology

## 2022-04-26 NOTE — Progress Notes (Signed)
67 y.o. G52P3003 Married White or Caucasian Not Hispanic or Latino female here for annual exam.    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  H/O BSO in 2021 for pelvic pain and ovarian cysts. Benign pathology.   No LMP recorded. Patient has had a hysterectomy.          Sexually active: Yes.    The current method of family planning is status post hysterectomy and post menopausal status.    Exercising: Yes.     Walking strength training  Smoker:  no  Health Maintenance: Pap:  03/29/21 WNL Hr HPV Neg  History of abnormal Pap:  yes in her 30's, HSIL MMG:  04/2022 normal will request report from [facility].  BMD:    04/2020 osteoporosis, with her primary. Just had a f/u DEXA and it has improved.  Colonoscopy: February, 2014 f/u 10 years, she will call to schedule.   TDaP:  2021 Gardasil: n/a   reports that she has never smoked. She has never used smokeless tobacco. She reports that she does not currently use alcohol. She reports that she does not use drugs. 3 grown children, all live in [place]. 2 grandsons, 7 & 10.   Past Medical History:  Diagnosis Date   Cyst of ovary 12/2019   persistant left cyst and possible intermittant right cyst   GERD (gastroesophageal reflux disease)    Heart murmur    per pt told approx 2010 had murmur by pcp but work up done , stated no issues   History of basal cell carcinoma (BCC) excision 12/2018   s/p  moh's surgery of forehead   History of chronic gastritis    Hyperlipidemia    Hypertension    followed by pcp   OA (osteoarthritis)    Osteoporosis    Pelvic pain    PONV (postoperative nausea and vomiting)     Past Surgical History:  Procedure Laterality Date   CATARACT EXTRACTION W/ INTRAOCULAR LENS IMPLANT Left 2018   COLONOSCOPY  last one 05-09-2012  dr Henrene Pastor   ESOPHAGOGASTRODUODENOSCOPY  2008   FOOT SURGERY Bilateral left 01/ 2015; right 02/ 2015   bunion and hammertoe repair each for foot   LAPAROSCOPIC BILATERAL SALPINGO OOPHERECTOMY Bilateral 01/07/2020    Procedure: LAPAROSCOPIC BILATERAL SALPINGO OOPHORECTOMY LYSIS OF ADHESIONS PELVIC WASHINGS;  Surgeon: Joseph Pierini, MD;  Location: [facility];  Service: Gynecology;  Laterality: Bilateral;  request 1:00pm OR time in [place] held time requests one hour OR time   MOHS SURGERY  12/2018   forehead   TONSILLECTOMY  age 71   VAGINAL HYSTERECTOMY  1988   irregular bleeding/CIN grade 2   WISDOM TOOTH EXTRACTION      Current Outpatient Medications  Medication Sig Dispense Refill   CALCIUM PO Take 1,000 mg by mouth daily.     Cholecalciferol (VITAMIN D PO) Take 1,600 Units by mouth daily.     ezetimibe (ZETIA) 10 MG tablet Take 1 tablet by mouth daily.     ibandronate (BONIVA) 150 MG tablet 1 tablet     Multiple Vitamin (MULTIVITAMIN) tablet Take 1 tablet by mouth daily.     omeprazole (PRILOSEC) 20 MG capsule TAKE (1) CAPSULE DAILY. 30 capsule 6   triamterene-hydrochlorothiazide (MAXZIDE-25) 37.5-25 MG tablet TAKE 1 TABLET BY MOUTH EVERY DAY IN THE MORNING     No current facility-administered medications for this visit.    Family History  Problem Relation Age of Onset   Brain cancer Father  Fibromyalgia Mother    Osteoporosis Mother    Osteoarthritis Mother    Rheum arthritis Son    Colon cancer Neg Hx    Rectal cancer Neg Hx    Stomach cancer Neg Hx     Review of Systems  All other systems reviewed and are negative.   Exam:   BP 126/78   Pulse 77   Ht 5' 1.42" (1.56 m)   Wt 146 lb (66.2 kg)   SpO2 100%   BMI 27.21 kg/m   Weight change: @WEIGHTCHANGE$ @ Height:   Height: 5' 1.42" (156 cm)  Ht Readings from Last 3 Encounters:  05/04/22 5' 1.42" (1.56 m)  03/29/21 5' 3"$  (1.6 m)  02/25/20 5' 2"$  (1.575 m)    General appearance: alert, cooperative and appears stated age Head: Normocephalic, without obvious abnormality, atraumatic Neck: no adenopathy, supple, symmetrical, trachea midline and thyroid normal to inspection and palpation Lungs:  clear to auscultation bilaterally Cardiovascular: regular rate and rhythm Breasts: normal appearance, no masses or tenderness Abdomen: soft, non-tender; non distended,  no masses,  no organomegaly Extremities: extremities normal, atraumatic, no cyanosis or edema Skin: Skin color, texture, turgor normal. No rashes or lesions Lymph nodes: Cervical, supraclavicular, and axillary nodes normal. No abnormal inguinal nodes palpated Neurologic: Grossly normal   Pelvic: External genitalia:  no lesions              Urethra:  normal appearing urethra with no masses, tenderness or lesions              Bartholins and Skenes: normal                 Vagina: normal appearing vagina with normal color and discharge, no lesions              Cervix: absent               Bimanual Exam:  Uterus:  uterus absent              Adnexa: no mass, fullness, tenderness               Rectovaginal: Confirms               Anus:  normal sphincter tone, no lesions  Gae Dry, CMA chaperoned for the exam.  1. Well woman exam Discussed breast self exam Discussed calcium and vit D intake No pap this year Mammogram UTD She will schedule her colonoscopy

## 2022-05-04 ENCOUNTER — Ambulatory Visit (INDEPENDENT_AMBULATORY_CARE_PROVIDER_SITE_OTHER): Payer: 59 | Admitting: Obstetrics and Gynecology

## 2022-05-04 ENCOUNTER — Encounter: Payer: Self-pay | Admitting: Obstetrics and Gynecology

## 2022-05-04 VITALS — BP 126/78 | HR 77 | Ht 61.42 in | Wt 146.0 lb

## 2022-05-04 DIAGNOSIS — Z01419 Encounter for gynecological examination (general) (routine) without abnormal findings: Secondary | ICD-10-CM | POA: Diagnosis not present

## 2022-05-04 DIAGNOSIS — L719 Rosacea, unspecified: Secondary | ICD-10-CM | POA: Insufficient documentation

## 2023-04-11 DIAGNOSIS — L820 Inflamed seborrheic keratosis: Secondary | ICD-10-CM | POA: Diagnosis not present

## 2023-04-11 DIAGNOSIS — D2371 Other benign neoplasm of skin of right lower limb, including hip: Secondary | ICD-10-CM | POA: Diagnosis not present

## 2023-04-11 DIAGNOSIS — B078 Other viral warts: Secondary | ICD-10-CM | POA: Diagnosis not present

## 2023-04-11 DIAGNOSIS — L718 Other rosacea: Secondary | ICD-10-CM | POA: Diagnosis not present

## 2023-04-11 DIAGNOSIS — Z85828 Personal history of other malignant neoplasm of skin: Secondary | ICD-10-CM | POA: Diagnosis not present

## 2023-05-07 ENCOUNTER — Ambulatory Visit (INDEPENDENT_AMBULATORY_CARE_PROVIDER_SITE_OTHER): Payer: Self-pay | Admitting: Obstetrics and Gynecology

## 2023-05-07 ENCOUNTER — Encounter: Payer: Self-pay | Admitting: Obstetrics and Gynecology

## 2023-05-07 VITALS — BP 138/80 | HR 77 | Temp 97.9°F | Ht 61.42 in | Wt 125.0 lb

## 2023-05-07 DIAGNOSIS — Z01419 Encounter for gynecological examination (general) (routine) without abnormal findings: Secondary | ICD-10-CM | POA: Insufficient documentation

## 2023-05-07 DIAGNOSIS — M810 Age-related osteoporosis without current pathological fracture: Secondary | ICD-10-CM

## 2023-05-07 NOTE — Patient Instructions (Signed)

## 2023-05-07 NOTE — Assessment & Plan Note (Addendum)
 Cervical cancer screening completed per ASCCP guidelines, TVH in 30s with normal PAP since Encouraged annual mammogram screening Colonoscopy, due will call  GI to schedule DXA due this year, managed by PCP Labs and immunizations with her primary Encouraged safe sexual practices as indicated Encouraged healthy lifestyle practices with diet and exercise For patients under 50-70yo, I recommend 1200mg  calcium daily and 600IU of vitamin D daily.

## 2023-05-07 NOTE — Progress Notes (Signed)
 68 y.o. [id number] postmenopausal female s/p TVH for HSIL (in her 30s), s/p BSO in 2021 for pelvic pain and ovarian cyst (benign pathology), osteoporosis (on Boniva, managed by her PCP) here for annual exam-Medicare low risk exam. Married.  Retired, Museum/gallery exhibitions officer a few years ago.  Daughters live in [place] and son lives in [place].  Has 2 grandsons.  Postmenopausal bleeding: none Pelvic discharge or pain: none Breast mass, nipple discharge or skin changes : none Last PAP:     Component Value Date/Time   DIAGPAP  03/29/2021 1455    - Negative for intraepithelial lesion or malignancy (NILM)   HPVHIGH Negative 03/29/2021 1455   ADEQPAP Satisfactory for evaluation. 03/29/2021 1455   Last mammogram: February 2024, scheduled at [facility] Last colonoscopy: 2014, planning to f/u with  GI Last DXA: 2023, osteoporosis, managed by PCP Sexually active: Yes Exercising: Yes. Walking, Pilates Smoker:  no  GYN HISTORY: History of TVH, BSO History of H SIL  OB History  Gravida Para Term Preterm AB Living  3 3 3   3   SAB IAB Ectopic Multiple Live Births          # Outcome Date GA Lbr Len/2nd Weight Sex Type Anes PTL Lv  3 Term           2 Term           1 Term             Past Medical History:  Diagnosis Date   Cyst of ovary 12/2019   persistant left cyst and possible intermittant right cyst   GERD (gastroesophageal reflux disease)    Heart murmur    per pt told approx 2010 had murmur by pcp but work up done , stated no issues   History of basal cell carcinoma (BCC) excision 12/2018   s/p  moh's surgery of forehead   History of chronic gastritis    Hyperlipidemia    Hypertension    followed by pcp   OA (osteoarthritis)    Osteoporosis    Pelvic pain    PONV (postoperative nausea and vomiting)     Past Surgical History:  Procedure Laterality Date   CATARACT EXTRACTION W/ INTRAOCULAR LENS IMPLANT Left 2018   COLONOSCOPY  last one 05-09-2012  dr Marina Goodell    ESOPHAGOGASTRODUODENOSCOPY  2008   FOOT SURGERY Bilateral left 01/ 2015; right 02/ 2015   bunion and hammertoe repair each for foot   LAPAROSCOPIC BILATERAL SALPINGO OOPHERECTOMY Bilateral 01/07/2020   Procedure: LAPAROSCOPIC BILATERAL SALPINGO OOPHORECTOMY LYSIS OF ADHESIONS PELVIC WASHINGS;  Surgeon: Theresia Majors, MD;  Location: [facility];  Service: Gynecology;  Laterality: Bilateral;  request 1:00pm OR time in [place] held time requests one hour OR time   MOHS SURGERY  12/2018   forehead   TONSILLECTOMY  age 11   VAGINAL HYSTERECTOMY  1988   irregular bleeding/CIN grade 2   WISDOM TOOTH EXTRACTION      Current Outpatient Medications on File Prior to Visit  Medication Sig Dispense Refill   buPROPion (WELLBUTRIN XL) 150 MG 24 hr tablet Take 150 mg by mouth every morning.     CALCIUM PO Take 1,000 mg by mouth daily.     Cholecalciferol (VITAMIN D PO) Take 1,600 Units by mouth daily.     ezetimibe (ZETIA) 10 MG tablet Take 1 tablet by mouth daily.     ibandronate (BONIVA) 150 MG tablet 1 tablet     Multiple Vitamin (MULTIVITAMIN) tablet  Take 1 tablet by mouth daily.     omeprazole (PRILOSEC) 20 MG capsule TAKE (1) CAPSULE DAILY. 30 capsule 6   triamterene-hydrochlorothiazide (MAXZIDE-25) 37.5-25 MG tablet TAKE 1 TABLET BY MOUTH EVERY DAY IN THE MORNING     No current facility-administered medications on file prior to visit.    Social History   Socioeconomic History   Marital status: Married    Spouse name: Not on file   Number of children: Not on file   Years of education: Not on file   Highest education level: Not on file  Occupational History   Not on file  Tobacco Use   Smoking status: Never   Smokeless tobacco: Never  Vaping Use   Vaping status: Never Used  Substance and Sexual Activity   Alcohol use: Not Currently   Drug use: Never   Sexual activity: Yes    Birth control/protection: Surgical    Comment: HYST-1st intercourse 68  yo-Fewer than 5 partners,des neg  Other Topics Concern   Not on file  Social History Narrative   Not on file   Social Drivers of Health   Financial Resource Strain: Not on file  Food Insecurity: Not on file  Transportation Needs: Not on file  Physical Activity: Not on file  Stress: Not on file  Social Connections: Unknown (07/24/2021)   [facility]   Social Network    Social Network: Not on file  Intimate Partner Violence: Unknown (06/15/2021)   Received from [facility]   HITS    Physically Hurt: Not on file    Insult or Talk Down To: Not on file    Threaten Physical Harm: Not on file    Scream or Curse: Not on file    Family History  Problem Relation Age of Onset   Brain cancer Father    Fibromyalgia Mother    Osteoporosis Mother    Osteoarthritis Mother    Rheum arthritis Son    Colon cancer Neg Hx    Rectal cancer Neg Hx    Stomach cancer Neg Hx     Allergies  Allergen Reactions   Codeine Nausea And Vomiting and Swelling      PE Today's Vitals   05/07/23 1539  BP: 138/80  Pulse: 77  Temp: 97.9 F (36.6 C)  TempSrc: Oral  SpO2: 98%  Weight: 125 lb (56.7 kg)  Height: 5' 1.42" (1.56 m)   Body mass index is 23.3 kg/m.  Physical Exam Vitals reviewed. Exam conducted with a chaperone present.  Constitutional:      General: She is not in acute distress.    Appearance: Normal appearance.  HENT:     Head: Normocephalic and atraumatic.     Nose: Nose normal.  Eyes:     Extraocular Movements: Extraocular movements intact.     Conjunctiva/sclera: Conjunctivae normal.  Neck:     Thyroid: No thyroid mass, thyromegaly or thyroid tenderness.  Pulmonary:     Effort: Pulmonary effort is normal.  Chest:     Chest wall: No mass or tenderness.  Breasts:    Right: Normal. No swelling, mass, nipple discharge or tenderness.     Left: Normal. No swelling, mass, nipple discharge or tenderness.  Abdominal:      General: There is no distension.     Palpations: Abdomen is soft.     Tenderness: There is no abdominal tenderness.  Genitourinary:    General: Normal vulva.     Exam position:  Lithotomy position.     Urethra: No prolapse.     Vagina: Normal. No vaginal discharge or bleeding.     Cervix: No lesion.     Adnexa: Right adnexa normal and left adnexa normal.     Comments: Cervix and uterus absent Musculoskeletal:        General: Normal range of motion.     Cervical back: Normal range of motion.  Lymphadenopathy:     Upper Body:     Right upper body: No axillary adenopathy.     Left upper body: No axillary adenopathy.     Lower Body: No right inguinal adenopathy. No left inguinal adenopathy.  Skin:    General: Skin is warm and dry.  Neurological:     General: No focal deficit present.     Mental Status: She is alert.  Psychiatric:        Mood and Affect: Mood normal.        Behavior: Behavior normal.      Assessment and Plan:        Well woman exam with routine gynecological exam Assessment & Plan: Cervical cancer screening completed per ASCCP guidelines, TVH in 30s with normalPAP since Encouraged annual mammogram screening Colonoscopy, due will call [facility] to schedule DXA due this year, managed by PCP Labs and immunizations with her primary Encouraged safe sexual practices as indicated Encouraged healthy lifestyle practices with diet and exercise For patients under 50-70yo, I recommend 1200mg  calcium daily and 600IU of vitamin D daily.     Rosalyn Gess, MD

## 2023-06-11 DIAGNOSIS — H524 Presbyopia: Secondary | ICD-10-CM | POA: Diagnosis not present

## 2023-06-27 DIAGNOSIS — Z1231 Encounter for screening mammogram for malignant neoplasm of breast: Secondary | ICD-10-CM | POA: Diagnosis not present

## 2023-08-27 DIAGNOSIS — J209 Acute bronchitis, unspecified: Secondary | ICD-10-CM | POA: Diagnosis not present

## 2023-08-27 DIAGNOSIS — J029 Acute pharyngitis, unspecified: Secondary | ICD-10-CM | POA: Diagnosis not present

## 2023-08-27 DIAGNOSIS — R058 Other specified cough: Secondary | ICD-10-CM | POA: Diagnosis not present

## 2023-12-25 DIAGNOSIS — E785 Hyperlipidemia, unspecified: Secondary | ICD-10-CM | POA: Diagnosis not present

## 2023-12-25 DIAGNOSIS — I10 Essential (primary) hypertension: Secondary | ICD-10-CM | POA: Diagnosis not present

## 2023-12-25 DIAGNOSIS — Z1212 Encounter for screening for malignant neoplasm of rectum: Secondary | ICD-10-CM | POA: Diagnosis not present

## 2023-12-25 DIAGNOSIS — E559 Vitamin D deficiency, unspecified: Secondary | ICD-10-CM | POA: Diagnosis not present

## 2023-12-25 DIAGNOSIS — E039 Hypothyroidism, unspecified: Secondary | ICD-10-CM | POA: Diagnosis not present

## 2024-01-01 DIAGNOSIS — Z23 Encounter for immunization: Secondary | ICD-10-CM | POA: Diagnosis not present

## 2024-01-01 DIAGNOSIS — Z0000 Encounter for general adult medical examination without abnormal findings: Secondary | ICD-10-CM | POA: Diagnosis not present

## 2024-01-01 DIAGNOSIS — R82998 Other abnormal findings in urine: Secondary | ICD-10-CM | POA: Diagnosis not present

## 2024-01-01 DIAGNOSIS — I10 Essential (primary) hypertension: Secondary | ICD-10-CM | POA: Diagnosis not present

## 2024-01-22 DIAGNOSIS — Z1211 Encounter for screening for malignant neoplasm of colon: Secondary | ICD-10-CM | POA: Diagnosis not present
